# Patient Record
Sex: Male | Born: 1985 | Race: Black or African American | Hispanic: No | Marital: Married | Smoking: Never smoker
Health system: Southern US, Community
[De-identification: ages and names within clinical notes are randomized; demographics above are authoritative.]

---

## 2006-01-22 HISTORY — PX: TOOTH EXTRACTION: SHX859

## 2013-06-16 ENCOUNTER — Ambulatory Visit (INDEPENDENT_AMBULATORY_CARE_PROVIDER_SITE_OTHER): Payer: Managed Care, Other (non HMO) | Admitting: Adult Health

## 2013-06-16 ENCOUNTER — Encounter (INDEPENDENT_AMBULATORY_CARE_PROVIDER_SITE_OTHER): Payer: Self-pay

## 2013-06-16 ENCOUNTER — Encounter: Payer: Self-pay | Admitting: Adult Health

## 2013-06-16 VITALS — BP 116/78 | HR 67 | Temp 98.5°F | Resp 16 | Ht 71.5 in | Wt 204.2 lb

## 2013-06-16 DIAGNOSIS — L659 Nonscarring hair loss, unspecified: Secondary | ICD-10-CM

## 2013-06-16 DIAGNOSIS — Z7182 Exercise counseling: Secondary | ICD-10-CM

## 2013-06-16 DIAGNOSIS — M542 Cervicalgia: Secondary | ICD-10-CM

## 2013-06-16 MED ORDER — METHOCARBAMOL 750 MG PO TABS: 750.0000 mg | ORAL_TABLET | Freq: Three times a day (TID) | ORAL | Status: DC | PRN

## 2013-06-16 NOTE — Patient Instructions (Signed)
   Thank you for choosing [facility] for your health care needs.  Please schedule your physical exam at your earliest convenience.  You will need to be fasting.  Take robaxin as need for muscle spasms.  Take ibuprofen 600 mg every 6 hours for 4 days.  Apply ice for 15 min alternating with heat. Do this approximately 3-4 times a day.

## 2013-06-16 NOTE — Progress Notes (Signed)
Pre visit review using our clinic review tool, if applicable. No additional management support is needed unless otherwise documented below in the visit note. 

## 2013-06-16 NOTE — Progress Notes (Signed)
Subjective:    Patient ID: Leon Shaw, male    DOB: 30-Dec-1985, 30 y.o.   MRN: [id number]  HPI 28 y/o AA male presents today to establish care. States that he is overall healthy and has not been to a healthcare provider recently. Neck pain since earlier yesterday, mild discomfort. Took ibuprofen with minimal benefit. Denies radiating pain. Turning to the left makes it worse - no real improvements. Indicates it is staying about the same.   He works out regularly with heavy weights at a gym. Does not stretch following his work out. He is very health conscious. Eats healthy, well balanced meals. Takes supplements for weight gain. Currently weighs 204 lbs and he is trying to get up to 215 lb.   Pt has recently been seen by Dermatology for receding hairline. His father is bald and he is concerned about hair loss. Derm started him on proscar but did not review side effects with him. He began to read up on the side effects and has some concerns.   Medical Hx: Non contributory  Past Surgical History  Procedure Laterality Date  . Tooth extraction  2008    Family History  Problem Relation Age of Onset  . Cancer Mother [id number]    breast ca  . Hypertension Mother   . Hypertension Father     History   Social History  . Marital Status: Single    Spouse Name: Leon Shaw    Number of Children: Leon Shaw  . Years of Education: 16   Occupational History  . Laboratory Technologist    Social History Main Topics  . Smoking status: Never Smoker   . Smokeless tobacco: Never Used  . Alcohol Use: 1.8 oz/week    3 Cans of beer per week  . Drug Use: No  . Sexual Activity: Not on file   Other Topics Concern  . Not on file   Social History Narrative   Born in [place] and [place], [place]. Graduated from college and [employer] hired after college. Enjoys working out, listening to music, video games. Has one son, Leon Shaw (6 years). Currently private residence (apartment) and Morrie Sheldonshley (girlfriend).      Review of Systems  Constitutional: Negative.   HENT: Negative.   Eyes: Negative.   Respiratory: Negative.   Cardiovascular: Negative.   Gastrointestinal: Negative.   Endocrine: Negative.   Genitourinary: Negative.   Musculoskeletal: Negative.   Skin: Negative.        Father is bald. He is concerned about hair loss and receding hairline. Followed by Vaughan Sineerm who has started him on proscar.   Allergic/Immunologic: Negative.   Neurological: Negative.   Hematological: Negative.   Psychiatric/Behavioral: Negative.        Objective:  BP 116/78  Pulse 67  Temp(Src) 98.5 F (36.9 C) (Oral)  Resp 16  Ht 5' 11.5" (1.816 m)  Wt 204 lb 4 oz (92.647 kg)  BMI 28.09 kg/m2  SpO2 98%   Physical Exam  Constitutional: He is oriented to person, place, and time. He appears well-developed and well-nourished. No distress.  Lean body mass.   HENT:  Head: Normocephalic and atraumatic.  Eyes: Conjunctivae and EOM are normal.  Cardiovascular: Normal rate, regular rhythm, normal heart sounds and intact distal pulses.  Exam reveals no gallop and no friction rub.   No murmur heard. Pulmonary/Chest: Effort normal and breath sounds normal. No respiratory distress. He has no wheezes. He has no rales.  Musculoskeletal: He exhibits tenderness.  Tightness and tenderness over  trapezius muscle (neck) on the left side. Has good ROM with slight tightness with turning head towards the left.   Neurological: He is alert and oriented to person, place, and time.  Skin: Skin is warm and dry.  Receding hairline  Psychiatric: He has a normal mood and affect. His behavior is normal. Judgment and thought content normal.       Assessment & Plan:   1. Neck pain on left side Occurred yesterday when he reached to pick something off the floor. Suspect muscle tightness, spasm involving trapezius muscle. Pt does heavy lifting exercises. Recommend that he take time to warm up and then to stretch following exercises.  Continue ibuprofen although instructed to take 600 mg every 6 hours for the next 4 days. Add robaxin 3 times a day as needed for spasms. Discussed possibility that medication may cause sedation and needs to avoid driving if taking. Apply ice alternating with heat to the affected area for 15 min at a time. Do this 4-5 times a day.  2. Hair loss Derm has recently started him on proscar. Discussed common side effects including hypotension, decreased libido, impotence, sexual dysfunction. Most of the sexual side effects increase with age. The dose used for alopecia is lower than the dose for BPH. Advised to report any of these side effects immediately should they occur.   3. Exercise counseling Warm up prior to exercise. Stretching following exercise. Incorporate cardio into your work out either through interval training or with specific cardio exercises at least 3 times a week for 30 min.

## 2013-06-16 NOTE — Progress Notes (Signed)
Patient ID: Leon Shaw, male   DOB: March 04, 1985, 28 y.o.   MRN: [id number]

## 2013-06-17 ENCOUNTER — Encounter: Payer: Self-pay | Admitting: Adult Health

## 2013-06-17 DIAGNOSIS — L659 Nonscarring hair loss, unspecified: Secondary | ICD-10-CM | POA: Insufficient documentation

## 2013-06-17 DIAGNOSIS — M542 Cervicalgia: Secondary | ICD-10-CM | POA: Insufficient documentation

## 2014-12-10 ENCOUNTER — Encounter: Payer: Self-pay | Admitting: Family Medicine

## 2014-12-10 ENCOUNTER — Ambulatory Visit
Admission: RE | Admit: 2014-12-10 | Discharge: 2014-12-10 | Disposition: A | Discharge status: Home / Self Care | Payer: Managed Care, Other (non HMO) | Source: Ambulatory Visit | Attending: Family Medicine | Admitting: Family Medicine

## 2014-12-10 ENCOUNTER — Ambulatory Visit (INDEPENDENT_AMBULATORY_CARE_PROVIDER_SITE_OTHER): Payer: Managed Care, Other (non HMO) | Admitting: Family Medicine

## 2014-12-10 VITALS — BP 110/66 | HR 64 | Temp 97.7°F | Ht 71.5 in | Wt 198.2 lb

## 2014-12-10 DIAGNOSIS — R0789 Other chest pain: Secondary | ICD-10-CM

## 2014-12-10 DIAGNOSIS — M940 Chondrocostal junction syndrome [Tietze]: Secondary | ICD-10-CM | POA: Diagnosis not present

## 2014-12-10 NOTE — Assessment & Plan Note (Signed)
Patient's discomfort is most consistent with costochondritis. Very atypical in nature and given this and a lack of cardiac risk factors in a healthy young patient this is unlikely to be a cardiac cause. Unlikely to be PE given normal vital signs and no prior history. Unlikely to be pulmonary process given normal exam and normal oxygenation. This is most likely musculoskeletal. Given that it occurred after lifting heavy weights and having a bar [place] bounce off his chest slightly we will obtain a chest x-ray to rule out occult fracture. If there is no fracture he will start taking ibuprofen 600 mg every 6 hours with food for 5 days and then as needed. He'll continue to ice the area. If no better he'll follow-up in the office. Given return precautions.

## 2014-12-10 NOTE — Patient Instructions (Signed)
Nice to meet you. Your discomfort is likely related to costochondritis, though given persistence and possible injury while lifting weights we will obtain a chest x-ray to evaluate the bony structures in your chest. If this is negative for fracture you'll need to take an anti-inflammatory such as ibuprofen 600 mg every 6 hours for the next 5 days. You can ice the area as well. You should take the ibuprofen with food. If you develop persistent chest pain, shortness of breath, palpitations, sweating, fever, or any new or change in symptoms please seek medical attention.

## 2014-12-10 NOTE — Progress Notes (Signed)
Patient ID: Leon Shaw, male   DOB: 09/01/1985, 29 y.o.   MRN: [id number]  Marikay Alar, MD Phone: [phone]  Leon Shaw is a 29 y.o. male who presents today for same-day visit.  Costochondritis: Patient notes about 5 months ago he was at the gym doing bench press when he noted a sharp stabbing sensation in the left side of his sternum. He notes this only occurred when he was actively doing bench press or pushups. Does not radiate anywhere. Has no shortness of breath, diaphoresis, or palpitations with this. He only notices the discomfort when he does a pushup-type maneuver or dips. He does not have the discomfort right now. He is not have the discomfort in between the above movements He notes when this occurred he was lifting about 325 pounds and noted that the bar bounced off his chest slightly. He has no history of cholesterol issues, hypertension, or diabetes. He has no history of DVT or PE. He has not had any leg swelling. He has not had any recent periods of immobility. He has no family history of heart disease. He has tried RICE for this with little benefit. It is slowly getting better, though it is not completely improved and he is ready to get back to the gym. He has not lifted since this occurred. He has not tried any anti-inflammatories for this.  PMH: nonsmoker.   ROS see history of present illness  Objective  Physical Exam Filed Vitals:   12/10/14 0917  BP: 110/66  Pulse: 64  Temp: 97.7 F (36.5 C)   Physical Exam  Constitutional: He is well-developed, well-nourished, and in no distress.  HENT:  Head: Normocephalic and atraumatic.  Cardiovascular: Normal rate, regular rhythm and normal heart sounds.  Exam reveals no gallop and no friction rub.   No murmur heard. Pulmonary/Chest: Effort normal and breath sounds normal. No respiratory distress. He has no wheezes. He has no rales. He exhibits tenderness (Mild discomfort on palpation of left costochondral  joints).  Neurological:  5/5 strength in bilateral biceps, triceps, grip, quads, hamstrings, plantar and dorsiflexion, sensation to light touch intact in bilateral UE and LE, normal gait, 2+ patellar reflexes  Skin: Skin is warm and dry. He is not diaphoretic.   hands are warm and well perfused   Assessment/Plan: Please see individual problem list.  Costochondritis Patient's discomfort is most consistent with costochondritis. Very atypical in nature and given this and a lack of cardiac risk factors in a healthy young patient this is unlikely to be a cardiac cause. Unlikely to be PE given normal vital signs and no prior history. Unlikely to be pulmonary process given normal exam and normal oxygenation. This is most likely musculoskeletal. Given that it occurred after lifting heavy weights and having a bar [place] bounce off his chest slightly we will obtain a chest x-ray to rule out occult fracture. If there is no fracture he will start taking ibuprofen 600 mg every 6 hours with food for 5 days and then as needed. He'll continue to ice the area. If no better he'll follow-up in the office. Given return precautions.    Orders Placed This Encounter  Procedures  . DG Chest 2 View    Standing Status: Future     Number of Occurrences: 1     Standing Expiration Date: 02/09/2016    Order Specific Question:  Reason for Exam (SYMPTOM  OR DIAGNOSIS REQUIRED)    Answer:  musculoskeletal sternum pain s/p bench press of 325 lbs 5  months ago    Order Specific Question:  Preferred imaging location?    Answer:  [facility]    Intisar Claudio

## 2014-12-10 NOTE — Progress Notes (Signed)
Pre visit review using our clinic review tool, if applicable. No additional management support is needed unless otherwise documented below in the visit note. 

## 2014-12-14 ENCOUNTER — Encounter: Payer: Self-pay | Admitting: Family Medicine

## 2014-12-27 ENCOUNTER — Ambulatory Visit (INDEPENDENT_AMBULATORY_CARE_PROVIDER_SITE_OTHER): Payer: Managed Care, Other (non HMO) | Admitting: Family Medicine

## 2014-12-27 ENCOUNTER — Encounter: Payer: Self-pay | Admitting: Family Medicine

## 2014-12-27 VITALS — BP 104/60 | HR 68 | Temp 98.1°F | Ht 71.5 in | Wt 204.8 lb

## 2014-12-27 DIAGNOSIS — M940 Chondrocostal junction syndrome [Tietze]: Secondary | ICD-10-CM

## 2014-12-27 DIAGNOSIS — Z0000 Encounter for general adult medical examination without abnormal findings: Secondary | ICD-10-CM | POA: Diagnosis not present

## 2014-12-27 DIAGNOSIS — E663 Overweight: Secondary | ICD-10-CM | POA: Diagnosis not present

## 2014-12-27 DIAGNOSIS — Z13220 Encounter for screening for lipoid disorders: Secondary | ICD-10-CM

## 2014-12-27 DIAGNOSIS — Z23 Encounter for immunization: Secondary | ICD-10-CM

## 2014-12-27 DIAGNOSIS — Z0001 Encounter for general adult medical examination with abnormal findings: Secondary | ICD-10-CM | POA: Insufficient documentation

## 2014-12-27 LAB — COMPREHENSIVE METABOLIC PANEL
ALBUMIN: 4.3 g/dL (ref 3.5–5.2)
ALK PHOS: 74 U/L (ref 39–117)
ALT: 37 U/L (ref 0–53)
AST: 34 U/L (ref 0–37)
BUN: 17 mg/dL (ref 6–23)
CALCIUM: 9.5 mg/dL (ref 8.4–10.5)
CO2: 28 mEq/L (ref 19–32)
Chloride: 105 mEq/L (ref 96–112)
Creatinine, Ser: 1.27 mg/dL (ref 0.40–1.50)
GFR: 85.01 mL/min (ref >60.00)
Glucose, Bld: 67 mg/dL — ABNORMAL LOW (ref 70–99)
POTASSIUM: 4.2 meq/L (ref 3.5–5.1)
Sodium: 140 mEq/L (ref 135–145)
TOTAL PROTEIN: 7.4 g/dL (ref 6.0–8.3)
Total Bilirubin: 1 mg/dL (ref 0.2–1.2)

## 2014-12-27 LAB — LIPID PANEL
CHOLESTEROL: 117 mg/dL (ref 0–200)
HDL: 46.1 mg/dL (ref >39.00)
LDL Cholesterol: 60 mg/dL (ref 0–99)
NonHDL: 70.83
Total CHOL/HDL Ratio: 3
Triglycerides: 56 mg/dL (ref 0.0–149.0)
VLDL: 11.2 mg/dL (ref 0.0–40.0)

## 2014-12-27 LAB — HEMOGLOBIN A1C: HEMOGLOBIN A1C: 5.5 % (ref 4.6–6.5)

## 2014-12-27 NOTE — Progress Notes (Signed)
Pre visit review using our clinic review tool, if applicable. No additional management support is needed unless otherwise documented below in the visit note. 

## 2014-12-27 NOTE — Assessment & Plan Note (Signed)
Patient doing well at this time. Technically by BMI he is overweight, though suspect this is mostly muscle mass. He does not smoke. He drinks minimal alcohol. His blood pressure is in the normal range. He exercises and eats well. Has had a negative HIV test per his report. We will give a flu shot and a Tdap. We will check lipid panel, CMP, and A1c.

## 2014-12-27 NOTE — Patient Instructions (Signed)
Nice to see you. Please continue ibuprofen as needed and the foam roller for the soreness you have. We will check some blood work today. If you develop chest pain, change in your soreness, shortness of breath, palpitations, or any new or change in symptoms please seek medical attention.

## 2014-12-27 NOTE — Assessment & Plan Note (Signed)
Much improved. We'll continue to monitor. He'll continue as needed ibuprofen. He'll continue the foam roller. He is given return precautions.

## 2014-12-27 NOTE — Progress Notes (Signed)
Patient ID: Leon Shaw, male   DOB: February 11, 1985, 29 y.o.   MRN: [id number]  Tommi Rumps, MD Phone: [phone]  Leon Shaw is a 29 y.o. male who presents today for yearly physical.  Patient does not smoke. He drinks alcohol 1-2 times a week. He exercises multiple days a week and lifts weights. He eats a healthy diet. Does not drink soda. He also takes water. He does drink sweet tea one time every couple weeks. He has been tested for HIV in the past and was negative. He has not had a flu shot this year. He cannot remember his last DTaP.  Patient notes his costochondritis is significantly better. Only hurts minimally when doing bench press-type exercises. He has taken ibuprofen and this helped. He has been using a foam roller as well with benefit. Has not had any radiation. Has not had any diaphoresis. Has not had any shortness of breath. No history of VTE. So exercise otherwise with no issues. No family history of cardiac issues. Chest x-ray last visit was normal. No risk factors for cardiac disease. No risk factors for VTE.    Review of Systems  Constitutional: Negative for fever.  HENT: Negative for congestion, hearing loss, sore throat and tinnitus.   Eyes: Negative for blurred vision and double vision.  Respiratory: Negative for cough and shortness of breath.   Cardiovascular: Negative for palpitations and leg swelling.  Gastrointestinal: Negative for heartburn, nausea, vomiting and abdominal pain.  Genitourinary: Negative for dysuria and urgency.  Musculoskeletal: Negative for myalgias, back pain, joint pain and neck pain.  Skin: Negative for rash.  Neurological: Negative for dizziness, sensory change, focal weakness and headaches.  Psychiatric/Behavioral: Negative for depression and memory loss.    Objective  Physical Exam Filed Vitals:   12/27/14 0933  BP: 104/60  Pulse: 68  Temp: 98.1 F (36.7 C)    Physical Exam  Constitutional: He is well-developed,  well-nourished, and in no distress.  HENT:  Head: Normocephalic and atraumatic.  Right Ear: External ear normal.  Left Ear: External ear normal.  Cardiovascular: Normal rate, regular rhythm and normal heart sounds.  Exam reveals no gallop and no friction rub.   No murmur heard. Pulmonary/Chest: Effort normal and breath sounds normal. No respiratory distress. He has no wheezes. He has no rales. He exhibits tenderness (left costochondral minimal soreness).  Abdominal: Soft. Bowel sounds are normal. He exhibits no distension. There is no tenderness. There is no rebound and no guarding.  Musculoskeletal: Normal range of motion. He exhibits no edema.  Neurological: He is alert. Gait normal.  Skin: Skin is warm. He is not diaphoretic.  Psychiatric: Mood and affect normal.     Assessment/Plan: Please see individual problem list.  Costochondritis Much improved. We'll continue to monitor. He'll continue as needed ibuprofen. He'll continue the foam roller. He is given return precautions.  Annual physical exam Patient doing well at this time. Technically by BMI he is overweight, though suspect this is mostly muscle mass. He does not smoke. He drinks minimal alcohol. His blood pressure is in the normal range. He exercises and eats well. Has had a negative HIV test per his report. We will give a flu shot and a Tdap. We will check lipid panel, CMP, and A1c.    Orders Placed This Encounter  Procedures  . Tdap vaccine greater than or equal to 7yo IM  . Lipid Profile  . Comp Met (CMET)  . HgB A1c    Tommi Rumps

## 2015-10-28 ENCOUNTER — Encounter: Payer: Self-pay | Admitting: Family Medicine

## 2015-10-28 ENCOUNTER — Ambulatory Visit (INDEPENDENT_AMBULATORY_CARE_PROVIDER_SITE_OTHER): Payer: Managed Care, Other (non HMO) | Admitting: Family Medicine

## 2015-10-28 VITALS — BP 118/86 | HR 69 | Temp 97.6°F | Wt 195.8 lb

## 2015-10-28 DIAGNOSIS — F4320 Adjustment disorder, unspecified: Secondary | ICD-10-CM

## 2015-10-28 DIAGNOSIS — F4321 Adjustment disorder with depressed mood: Secondary | ICD-10-CM

## 2015-10-28 DIAGNOSIS — Z23 Encounter for immunization: Secondary | ICD-10-CM | POA: Diagnosis not present

## 2015-10-28 MED ORDER — CLONAZEPAM 0.5 MG PO TABS: 0.5000 mg | ORAL_TABLET | Freq: Two times a day (BID) | ORAL | 1 refills | Status: DC | PRN

## 2015-10-28 NOTE — Patient Instructions (Signed)
Complicated Grieving Grief is a normal response to the death of someone close to you. Feelings of fear, anger, and guilt can affect almost everyone who loses a loved one. It is also common to have symptoms of depression while you are grieving. These include problems with sleep, loss of appetite, and lack of energy. They may last for weeks or months after a loss. Complicated grief is different from normal grief or depression. Normal grieving involves sadness and feelings of loss, but these feelings are not constant. Complicated grief is a constant and severe type of grief. It interferes with your ability to function normally. It may last for several months to a year or longer. Complicated grief may require treatment from a mental health care provider. CAUSES  It is not known why some people continue to struggle with grief and others do not. You may be at higher risk for complicated grief if:  The death of your loved one was sudden or unexpected.  The death of your loved one was due to a violent event.  Your loved one committed suicide.  Your loved one was a child or a young person.  You were very close to or dependent on the loved one.  You have a history of depression. SIGNS AND SYMPTOMS Signs and symptoms of complicated grief may include:  Feeling disbelief or numbness.  Being unable to enjoy good memories of your loved one.  Needing to avoid anything that reminds you of your loved one.  Being unable to stop thinking about the death.  Feeling intense anger or guilt.  Feeling alone and hopeless.  Feeling that your life is meaningless and empty.  Losing the desire to live. DIAGNOSIS Your health care provider may diagnose complicated grief if:  You have constant symptoms of grief for 6-12 months or longer.  Your symptoms are interfering with your ability to live your life. Your health care provider may want you to see a mental health care provider. Many symptoms of depression  are similar to the symptoms of complicated grief. It is important to be evaluated for complicated grief along with other mental health conditions. TREATMENT  Talk therapy with a mental health provider is the most common treatment for complicated grief. During therapy, you will learn healthy ways to cope with the loss of your loved one. In some cases, your mental health care provider may also recommend antidepressant medicines. HOME CARE INSTRUCTIONS  Take care of yourself.  Eat regular meals and maintain a healthy diet. Eat plenty of fruits, vegetables, and whole grains.  Try to get some exercise each day.  Keep regular hours for sleep. Try to get at least 8 hours of sleep each night.  Do not use drugs or alcohol to ease your symptoms.  Take medicines only as directed by your health care provider.  Spend time with friends and loved ones.  Consider joining a grief (bereavement) support group to help you deal with your loss.  Keep all follow-up visits as directed by your health care provider. This is important. SEEK MEDICAL CARE IF:  Your symptoms keep you from functioning normally.  Your symptoms do not get better with treatment. SEEK IMMEDIATE MEDICAL CARE IF:  You have serious thoughts of hurting yourself or someone else.  You have suicidal feelings.   This information is not intended to replace advice given to you by your health care provider. Make sure you discuss any questions you have with your health care provider.   Document Released: 01/08/2005   Document Revised: 09/29/2014 Document Reviewed: 06/18/2013 Elsevier Interactive Patient Education 2016 Elsevier Inc.  

## 2015-10-28 NOTE — Progress Notes (Signed)
Subjective:  Patient ID: Leon Shaw, male    DOB: 07-22-1985  Age: 30 y.o. MRN: [id number]  CC: Several issues; recent death of father  HPI:  30 year old male presents with several complaints.  Patient states that his father passed away abruptly on 11-12-2022. Since that time, he's had significant difficulty coping. He states that it feels like he is in a "mental fog". He is not sleeping well. He's had decrease in appetite. He is not exercising. He states that he feels anxious and is having "stomach problems" and diarrhea. He states that he's talked with his family members but continues to have difficulty coping with the loss of his father. He states his girlfriend was concerned about him and thought he should come in for evaluation. He was in agreement with this. He states that he is unsure what to do and how to make things better.  Social Hx  Social History   Social History  . Marital status: Single    Spouse name: N/A  . Number of children: N/A  . Years of education: 19   Occupational History  . Laboratory Technologist [employer]   Social History Main Topics  . Smoking status: Never Smoker  . Smokeless tobacco: Never Used  . Alcohol use 1.8 oz/week    3 Cans of beer per week  . Drug use: No  . Sexual activity: Not Asked   Other Topics Concern  . None   Social History Narrative   Born in [place], [place] and raised [place], [place]. Graduated from college and [employer] hired after college. Enjoys working out, listening to music, video games. Has one son, Joselyn Glassman (6 years). Currently private residence (apartment) and Morrie Sheldon (girlfriend).    Review of Systems  Constitutional: Positive for appetite change and fatigue.  Gastrointestinal: Positive for abdominal pain and diarrhea.  Psychiatric/Behavioral: The patient is nervous/anxious.    Objective:  BP 118/86 (BP Location: Right Arm, Patient Position: Sitting, Cuff Size: Normal)   Pulse 69   Temp 97.6 F (36.4 C) (Oral)   Wt 195 lb  12 oz (88.8 kg)   SpO2 97%   BMI 26.92 kg/m   BP/Weight 10/28/2015 12/27/2014 12/10/2014  Systolic BP 118 104 110  Diastolic BP 86 60 66  Wt. (Lbs) 195.75 204.75 198.2  BMI 26.92 28.16 27.26    Physical Exam  Constitutional: He is oriented to person, place, and time. He appears well-developed. No distress.  Cardiovascular: Normal rate and regular rhythm.   Pulmonary/Chest: Effort normal. He has no wheezes. He has no rales.  Abdominal: Soft. He exhibits no distension. There is no tenderness. There is no rebound and no guarding.  Neurological: He is alert and oriented to person, place, and time.  Psychiatric:  Flat affect. Appears down/depressed.  Vitals reviewed.  Lab Results  Component Value Date   GLUCOSE 67 (L) 12/27/2014   CHOL 117 12/27/2014   TRIG 56.0 12/27/2014   HDL 46.10 12/27/2014   LDLCALC 60 12/27/2014   ALT 37 12/27/2014   AST 34 12/27/2014   NA 140 12/27/2014   K 4.2 12/27/2014   CL 105 12/27/2014   CREATININE 1.27 12/27/2014   BUN 17 12/27/2014   CO2 28 12/27/2014   HGBA1C 5.5 12/27/2014    Assessment & Plan:   Problem List Items Addressed This Visit    Grief reaction - Primary    New problem. Patient with somatic complaints and having difficulty coping with loss of his father. Discussed the need to see a counselor and  patient is in agreement. PRN Klonopin.       Other Visit Diagnoses   None.     Meds ordered this encounter  Medications  . clonazePAM (KLONOPIN) 0.5 MG tablet    Sig: Take 1 tablet (0.5 mg total) by mouth 2 (two) times daily as needed for anxiety.    Dispense:  20 tablet    Refill:  1    Follow-up: Return in about 2 weeks (around 11/11/2015) for With me or PCP.  Everlene OtherJayce Alpa Salvo DO [facility] [place]

## 2015-10-28 NOTE — Progress Notes (Signed)
Pre visit review using our clinic review tool, if applicable. No additional management support is needed unless otherwise documented below in the visit note. 

## 2015-10-28 NOTE — Assessment & Plan Note (Signed)
New problem. Patient with somatic complaints and having difficulty coping with loss of his father. Discussed the need to see a counselor and patient is in agreement. PRN Klonopin.

## 2015-11-10 ENCOUNTER — Ambulatory Visit: Payer: Managed Care, Other (non HMO) | Admitting: Family Medicine

## 2015-12-28 ENCOUNTER — Encounter: Payer: Managed Care, Other (non HMO) | Admitting: Family Medicine

## 2015-12-28 DIAGNOSIS — Z0289 Encounter for other administrative examinations: Secondary | ICD-10-CM

## 2017-04-26 IMAGING — CR DG CHEST 2V
1 series · 2 of 2 positions shown · non-contrast
Comparison: None.

CLINICAL DATA: Musculoskeletal chest pain

EXAM:
CHEST  2 VIEW

[Series 1: dg chest 2 view · 0.14mm/px · 2 of 2 slices shown]
[im 1/2]
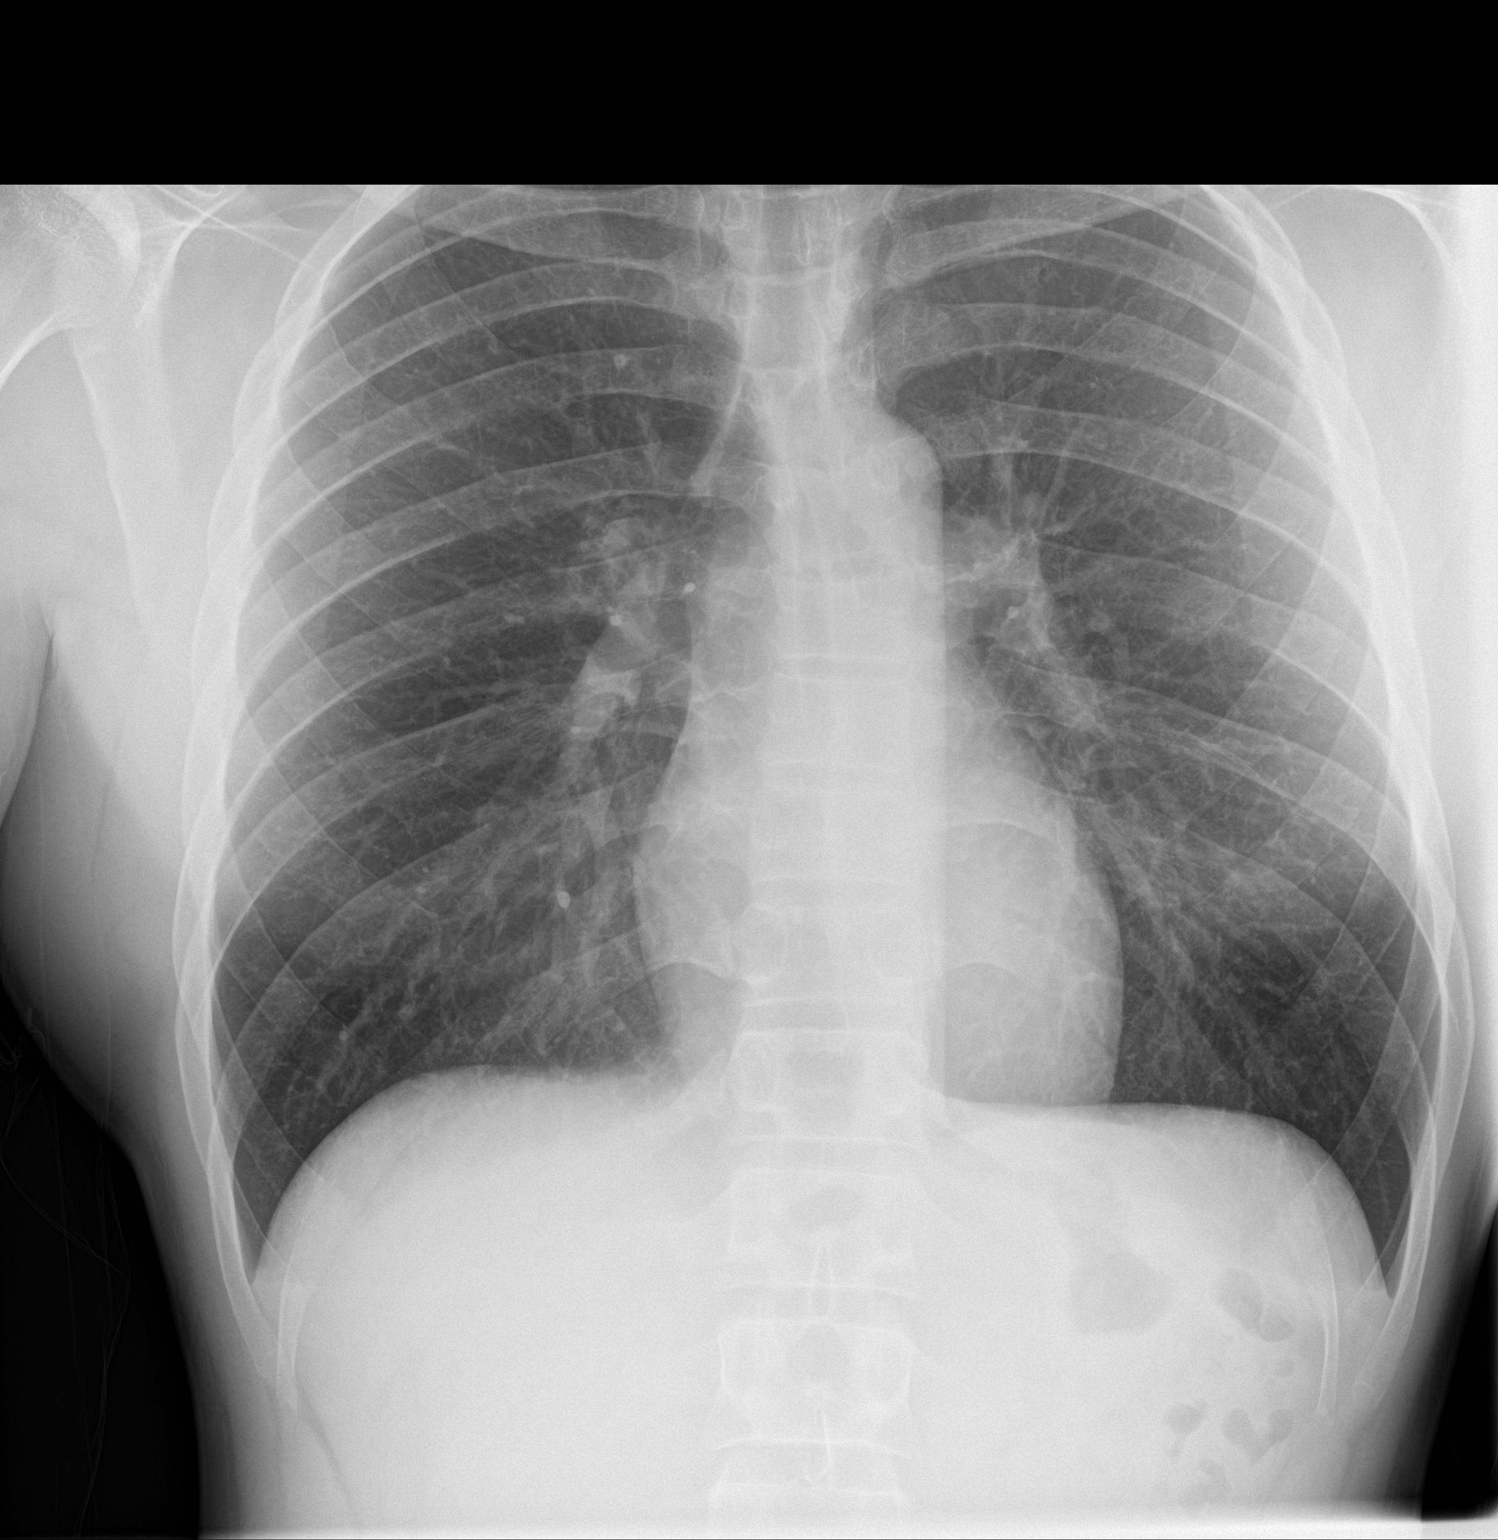
[im 2/2]
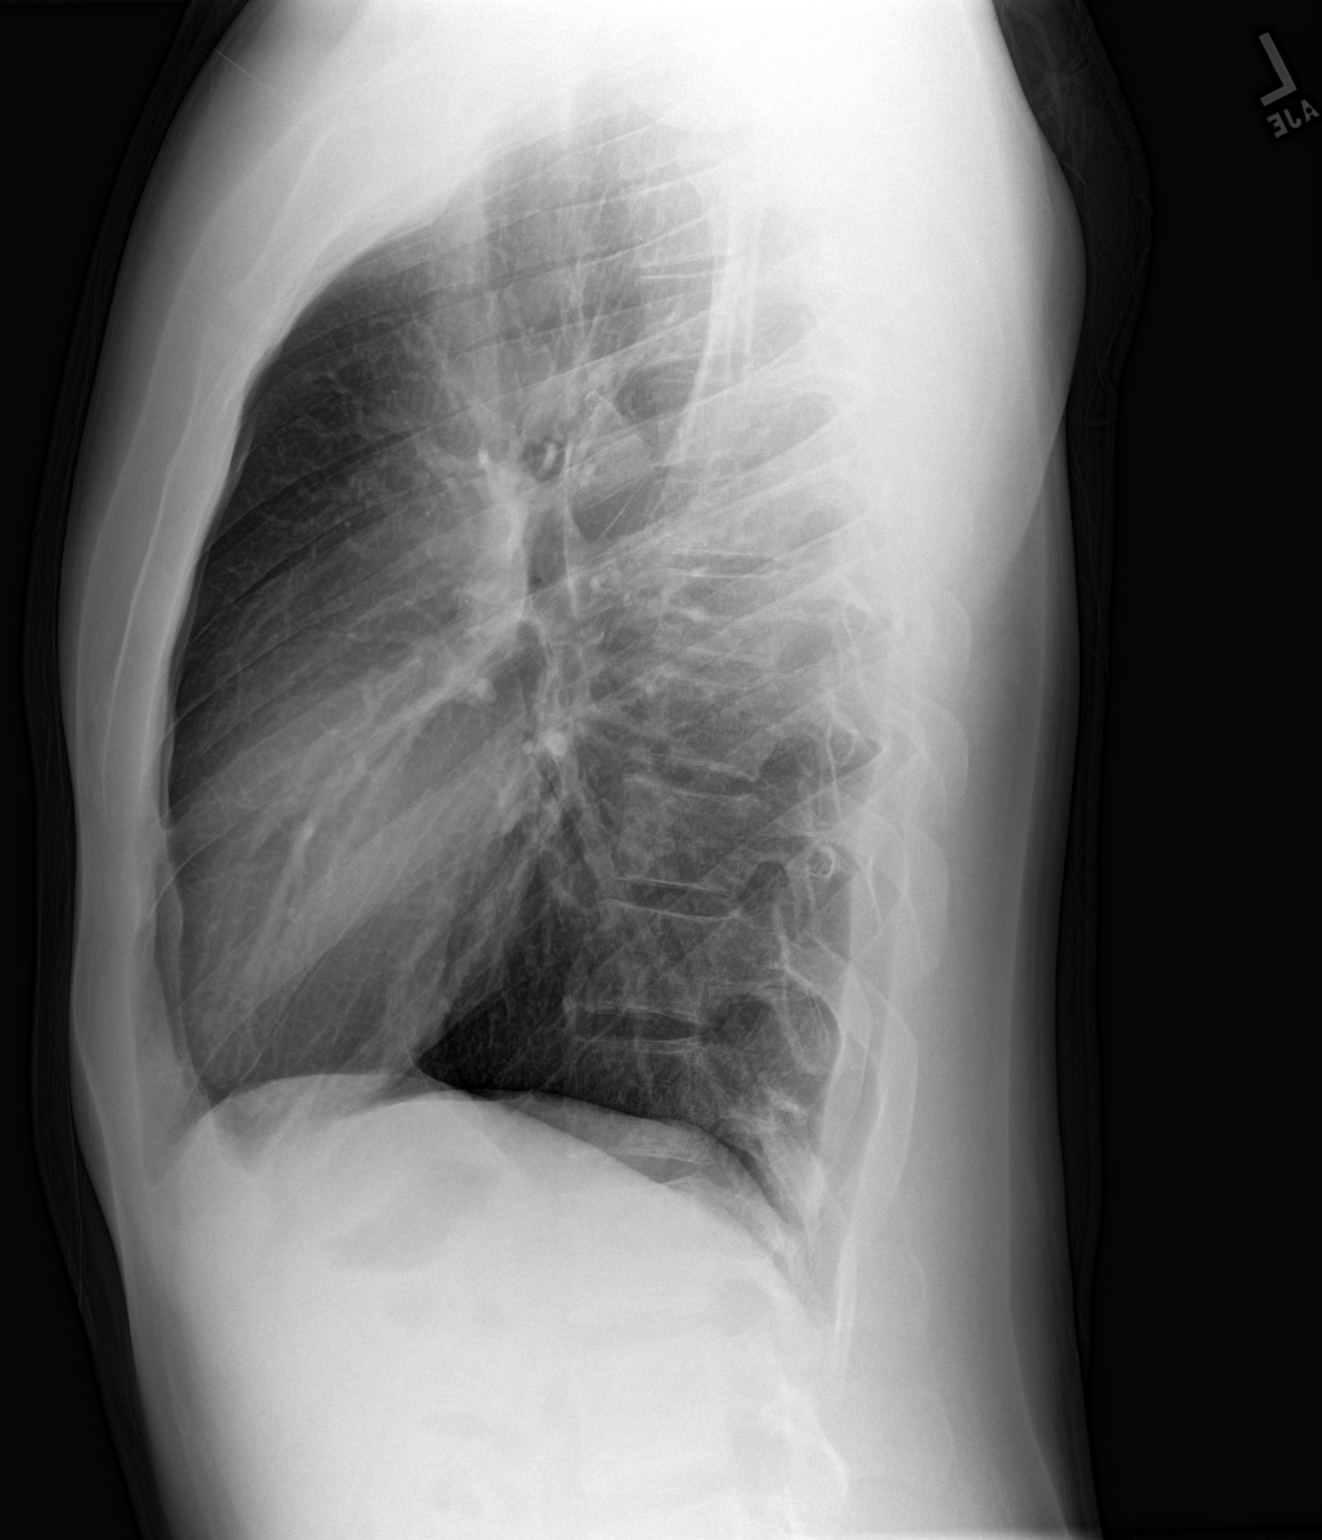

[2 of 2 positions shown; findings below may reference images not displayed]

FINDINGS: The heart size and mediastinal contours are within normal limits.
Both lungs are clear. The visualized skeletal structures are
unremarkable.
IMPRESSION: No active cardiopulmonary disease.

## 2017-09-16 ENCOUNTER — Ambulatory Visit (INDEPENDENT_AMBULATORY_CARE_PROVIDER_SITE_OTHER): Payer: Managed Care, Other (non HMO) | Admitting: Family Medicine

## 2017-09-16 ENCOUNTER — Encounter: Payer: Self-pay | Admitting: Family Medicine

## 2017-09-16 VITALS — BP 110/78 | HR 56 | Temp 98.1°F | Ht 72.5 in | Wt 209.6 lb

## 2017-09-16 DIAGNOSIS — L230 Allergic contact dermatitis due to metals: Secondary | ICD-10-CM | POA: Diagnosis not present

## 2017-09-16 DIAGNOSIS — F419 Anxiety disorder, unspecified: Secondary | ICD-10-CM | POA: Insufficient documentation

## 2017-09-16 DIAGNOSIS — Z0001 Encounter for general adult medical examination with abnormal findings: Secondary | ICD-10-CM

## 2017-09-16 DIAGNOSIS — L259 Unspecified contact dermatitis, unspecified cause: Secondary | ICD-10-CM | POA: Insufficient documentation

## 2017-09-16 NOTE — Assessment & Plan Note (Signed)
I suspect he is allergic to the metal in his belt buckle.  Rash is consistent with contact dermatitis.  Discussed buying a different belt.  He can use hydrocortisone over-the-counter as needed.

## 2017-09-16 NOTE — Patient Instructions (Signed)
Nice to see you. Please look at the information on Lexapro if you would like to try this for your anxiety please let us know.  We could also refer you to a counselor as well. Please try to get a different belt to see if that is beneficial.  You can also use hydrocortisone on the area of the rash.

## 2017-09-16 NOTE — Assessment & Plan Note (Addendum)
Chronic issue.  Discussed potentially starting on Lexapro versus doing therapy.  He will review information on Lexapro and let us know how he would like to proceed.  Follow-up in 2 to 3 months.

## 2017-09-16 NOTE — Progress Notes (Signed)
Leon Rumps, MD Phone: [phone]  Leon Shaw is a 32 y.o. male who presents today for CPE.  Exercises 4-5 days per week by lifting weights and running. Eats home cooked meals for the most part.  Does get a good amount of vegetables. No family history of prostate cancer or colon cancer. Does report prior HIV testing. Tetanus vaccination up-to-date. He sees a dentist 2 times per year and ophthalmologist once per year. He reports having 1-2 alcoholic beverages about twice a week.  No illicit drugs or tobacco use. He reports having biometric screening done through work about a month ago. He does report some anxiety.  This has been a long-term issue.  Did worsen some after his father passed away a couple of years ago.  He notes being afraid to try different things.  He has seen a counselor in the past though only saw them once.  No depression. Does report a rash in the area where his belt contacts his skin.  Typically only occurs if he falls asleep while wearing his belt.  He has tried hydrocortisone on it with good benefit.  Active Ambulatory Problems    Diagnosis Date Noted  . Encounter for general adult medical examination with abnormal findings 12/27/2014  . Grief reaction 10/28/2015  . Anxiety 09/16/2017  . Contact dermatitis 09/16/2017   Resolved Ambulatory Problems    Diagnosis Date Noted  . Neck pain on left side 06/17/2013  . Hair loss 06/17/2013  . Costochondritis 12/10/2014   No Additional Past Medical History    Family History  Problem Relation Age of Onset  . Cancer Mother 45       breast ca  . Hypertension Mother   . Hypertension Father     Social History   Socioeconomic History  . Marital status: Single    Spouse name: Not on file  . Number of children: Not on file  . Years of education: 33  . Highest education level: Not on file  Occupational History  . Occupation: Designer, television/film set: [employer]  . Financial  resource strain: Not on file  . Food insecurity:    Worry: Not on file    Inability: Not on file  . Transportation needs:    Medical: Not on file    Non-medical: Not on file  Tobacco Use  . Smoking status: Never Smoker  . Smokeless tobacco: Never Used  Substance and Sexual Activity  . Alcohol use: Yes    Alcohol/week: 3.0 standard drinks    Types: 3 Cans of beer per week  . Drug use: No  . Sexual activity: Not on file  Lifestyle  . Physical activity:    Days per week: Not on file    Minutes per session: Not on file  . Stress: Not on file  Relationships  . Social connections:    Talks on phone: Not on file    Gets together: Not on file    Attends religious service: Not on file    Active member of club or organization: Not on file    Attends meetings of clubs or organizations: Not on file    Relationship status: Not on file  . Intimate partner violence:    Fear of current or ex partner: Not on file    Emotionally abused: Not on file    Physically abused: Not on file    Forced sexual activity: Not on file  Other Topics Concern  . Not on file  Social History Narrative   Born in [place], [place] and raised [place], [place]. Graduated from college and [employer] hired after college. Enjoys working out, listening to music, video games. Has one son, Leon Shaw (6 years). Currently private residence (apartment) and Leon Shaw (girlfriend).     ROS  General:  Negative for nexplained weight loss, fever Skin: Negative for new or changing mole, sore that won't heal HEENT: Negative for trouble hearing, trouble seeing, ringing in ears, mouth sores, hoarseness, change in voice, dysphagia. CV:  Negative for chest pain, dyspnea, edema, palpitations Resp: Negative for cough, dyspnea, hemoptysis GI: Negative for nausea, vomiting, diarrhea, constipation, abdominal pain, melena, hematochezia. GU: Negative for dysuria, incontinence, urinary hesitance, hematuria, vaginal or penile discharge, polyuria, sexual  difficulty, lumps in testicle or breasts MSK: Negative for muscle cramps or aches, joint pain or swelling Neuro: Negative for headaches, weakness, numbness, dizziness, passing out/fainting Psych: Positive for anxiety, negative for depression, memory problems  Objective  Physical Exam Vitals:   09/16/17 1416  BP: 110/78  Pulse: (!) 56  Temp: 98.1 F (36.7 C)  SpO2: 98%    BP Readings from Last 3 Encounters:  09/16/17 110/78  10/28/15 118/86  12/27/14 104/60   Wt Readings from Last 3 Encounters:  09/16/17 209 lb 9.6 oz (95.1 kg)  10/28/15 195 lb 12 oz (88.8 kg)  12/27/14 204 lb 12 oz (92.9 kg)    Physical Exam  Constitutional: No distress.  HENT:  Head: Normocephalic and atraumatic.  Mouth/Throat: Oropharynx is clear and moist.  Eyes: Pupils are equal, round, and reactive to light. Conjunctivae are normal.  Cardiovascular: Normal rate, regular rhythm and normal heart sounds.  Pulmonary/Chest: Effort normal and breath sounds normal.  Abdominal: Soft. Bowel sounds are normal. He exhibits no distension. There is no tenderness. There is no rebound and no guarding.  Musculoskeletal: He exhibits no edema.  Neurological: He is alert.  Skin: Skin is warm and dry. He is not diaphoretic.     Psychiatric:  Mood anxious, affect normal     Assessment/Plan:   Encounter for general adult medical examination with abnormal findings Physical exam completed.  Encouraged continued diet and exercise.  He will get a flu shot through work.  We will check a CMP today.  He pulled up his lab work from his biometric screening and his cholesterol was well controlled.  His A1c was in the normal range.  He was immune to measles, mumps, and rubella.  Anxiety Chronic issue.  Discussed potentially starting on Lexapro versus doing therapy.  He will review information on Lexapro and let us know how he would like to proceed.  Follow-up in 2 to 3 months.  Contact dermatitis I suspect he is allergic  to the metal in his belt buckle.  Rash is consistent with contact dermatitis.  Discussed buying a different belt.  He can use hydrocortisone over-the-counter as needed.   Orders Placed This Encounter  Procedures  . Comp Met (CMET)    No orders of the defined types were placed in this encounter.    Leon Rumps, MD [place]

## 2017-09-16 NOTE — Assessment & Plan Note (Addendum)
Physical exam completed.  Encouraged continued diet and exercise.  He will get a flu shot through work.  We will check a CMP today.  He pulled up his lab work from his biometric screening and his cholesterol was well controlled.  His A1c was in the normal range.  He was immune to measles, mumps, and rubella.

## 2017-09-17 LAB — COMPREHENSIVE METABOLIC PANEL
A/G RATIO: 1.9 (ref 1.2–2.2)
ALBUMIN: 4.7 g/dL (ref 3.5–5.5)
ALK PHOS: 73 IU/L (ref 39–117)
ALT: 24 IU/L (ref 0–44)
AST: 22 IU/L (ref 0–40)
BILIRUBIN TOTAL: 0.8 mg/dL (ref 0.0–1.2)
BUN / CREAT RATIO: 13 (ref 9–20)
BUN: 16 mg/dL (ref 6–20)
CO2: 23 mmol/L (ref 20–29)
CREATININE: 1.2 mg/dL (ref 0.76–1.27)
Calcium: 9.5 mg/dL (ref 8.7–10.2)
Chloride: 103 mmol/L (ref 96–106)
GFR calc Af Amer: 91 mL/min/{1.73_m2} (ref >59)
GFR calc non Af Amer: 79 mL/min/{1.73_m2} (ref >59)
GLOBULIN, TOTAL: 2.5 g/dL (ref 1.5–4.5)
Glucose: 93 mg/dL (ref 65–99)
POTASSIUM: 4.8 mmol/L (ref 3.5–5.2)
SODIUM: 142 mmol/L (ref 134–144)
Total Protein: 7.2 g/dL (ref 6.0–8.5)

## 2017-09-18 ENCOUNTER — Encounter: Payer: Self-pay | Admitting: Family Medicine

## 2017-09-19 MED ORDER — ESCITALOPRAM OXALATE 10 MG PO TABS: 10.0000 mg | ORAL_TABLET | Freq: Every day | ORAL | 3 refills | Status: DC

## 2017-11-12 ENCOUNTER — Ambulatory Visit: Payer: Managed Care, Other (non HMO) | Admitting: Family Medicine

## 2017-11-12 DIAGNOSIS — Z0289 Encounter for other administrative examinations: Secondary | ICD-10-CM

## 2018-06-03 ENCOUNTER — Telehealth: Payer: Self-pay | Admitting: Family Medicine

## 2018-06-03 ENCOUNTER — Other Ambulatory Visit: Payer: Self-pay

## 2018-06-03 ENCOUNTER — Encounter: Payer: Self-pay | Admitting: Family Medicine

## 2018-06-03 ENCOUNTER — Ambulatory Visit (INDEPENDENT_AMBULATORY_CARE_PROVIDER_SITE_OTHER): Payer: Managed Care, Other (non HMO) | Admitting: Family Medicine

## 2018-06-03 DIAGNOSIS — F419 Anxiety disorder, unspecified: Secondary | ICD-10-CM

## 2018-06-03 MED ORDER — ESCITALOPRAM OXALATE 10 MG PO TABS: 10.0000 mg | ORAL_TABLET | Freq: Every day | ORAL | 3 refills | Status: DC

## 2018-06-03 NOTE — Telephone Encounter (Signed)
Please contact the patient and get him set up for follow-up in 2 months.  Thanks.

## 2018-06-03 NOTE — Assessment & Plan Note (Addendum)
Worsened issues with this.  We will start him on Lexapro.  Discussed potential side effects of drowsiness, insomnia, sexual dysfunction, and worsening anxiety.  If these occur he will let us know.  Offered referral for therapy though he deferred this.  Follow-up in 2 months.

## 2018-06-03 NOTE — Progress Notes (Signed)
Virtual Visit via telephone note  This visit type was conducted due to national recommendations for restrictions regarding the COVID-19 pandemic (e.g. social distancing).  This format is felt to be most appropriate for this patient at this time.  All issues noted in this document were discussed and addressed.  No physical exam was performed (except for noted visual exam findings with Video Visits).   I connected with Leon Shaw today at  3:00 PM EDT by telephone and verified that I am speaking with the correct person using two identifiers. Location patient: car, not driving Location provider: work or home office Persons participating in the virtual visit: patient, provider  I discussed the limitations, risks, security and privacy concerns of performing an evaluation and management service by telephone and the availability of in person appointments. I also discussed with the patient that there may be a patient responsible charge related to this service. The patient expressed understanding and agreed to proceed.  Reason for visit: f/u  HPI: Anxiety: Patient notes he has been having more issues with this lately.  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  He wakes up anxious and it lasts all day until he goes to bed.  He is not consciously aware of anything else that is affecting this.  He notes no depression.  He was previously prescribed Lexapro though did not start on this given concerns about starting the medication.  He does take finasteride for his hair.  This is prescribed to an online physician.   ROS: See pertinent positives and negatives per HPI.  No past medical history on file.  Past Surgical History:  Procedure Laterality Date  . TOOTH EXTRACTION  2008    Family History  Problem Relation Age of Onset  . Cancer Mother 56       breast ca  . Hypertension Mother   . Hypertension Father     SOCIAL HX:  nonsmoker   Current Outpatient Medications:  .  escitalopram (LEXAPRO) 10 MG tablet, Take 1 tablet (10 mg total) by mouth daily., Disp: 30 tablet, Rfl: 3 .  finasteride (PROSCAR) 5 MG tablet, Take 5 mg by mouth daily., Disp: , Rfl:  .  Multiple Vitamin (MULTIVITAMIN) tablet, Take 1 tablet by mouth daily., Disp: , Rfl:  .  UNABLE TO FIND, Med Name: Hims 6mL AntiAge Med (0.022%), Disp: , Rfl:   EXAM: This was a telehealth telephone visit and there was no physical exam completed.  ASSESSMENT AND PLAN:  Discussed the following assessment and plan:  Anxiety  Anxiety Worsened issues with this.  We will start him on Lexapro.  Discussed potential side effects of drowsiness, insomnia, sexual dysfunction, and worsening anxiety.  If these occur he will let us know.  Offered referral for therapy though he deferred this.  Follow-up in 2 months.  CMA will contact patient for follow-up to be scheduled in 2 months.  Social distancing precautions and sick precautions given regarding COVID-19.    I discussed the assessment and treatment plan with the patient. The patient was provided an opportunity to ask questions and all were answered. The patient agreed with the plan and demonstrated an understanding of the instructions.   The patient was advised to call back or seek an in-person evaluation if the symptoms worsen or if the condition fails to improve as anticipated.  I spent 12 minutes of non-face-to-face time on this encounter.  Marikay Alar,  MD

## 2018-06-05 NOTE — Telephone Encounter (Signed)
Unable to leave message for patient to return call back, VM box full. PEC may give and obtain information.  

## 2018-12-25 ENCOUNTER — Other Ambulatory Visit: Payer: Self-pay

## 2018-12-25 DIAGNOSIS — Z20822 Contact with and (suspected) exposure to covid-19: Secondary | ICD-10-CM

## 2018-12-29 LAB — NOVEL CORONAVIRUS, NAA: SARS-CoV-2, NAA: NOT DETECTED

## 2019-03-18 ENCOUNTER — Encounter: Payer: Self-pay | Admitting: Family Medicine

## 2019-03-18 ENCOUNTER — Other Ambulatory Visit: Payer: Self-pay

## 2019-03-18 ENCOUNTER — Ambulatory Visit (INDEPENDENT_AMBULATORY_CARE_PROVIDER_SITE_OTHER): Payer: Managed Care, Other (non HMO) | Admitting: Family Medicine

## 2019-03-18 VITALS — BP 125/80 | HR 84 | Temp 97.7°F | Ht 72.0 in | Wt 200.4 lb

## 2019-03-18 DIAGNOSIS — F322 Major depressive disorder, single episode, severe without psychotic features: Secondary | ICD-10-CM | POA: Insufficient documentation

## 2019-03-18 MED ORDER — SERTRALINE HCL 50 MG PO TABS: 50.0000 mg | ORAL_TABLET | Freq: Every day | ORAL | 3 refills | Status: DC

## 2019-03-18 NOTE — Patient Instructions (Addendum)
Nice to see. We will start on Zoloft.  If you develop any side effects that we discussed please let me know. I have also referred you for therapy.  If this is not affordable you could try counseling through your work. If you develop thoughts of harming yourself please seek medical attention immediately in the emergency room.

## 2019-03-18 NOTE — Assessment & Plan Note (Signed)
Patient with depression.  Significant elevation of PHQ-9 score.  Some thoughts of being better off not being here though no thoughts of killing himself.  He is interested in therapy and medication.  We will trial Zoloft.  I will refer for therapy.  He will check with his insurance to see if they cover therapy.  Advised if he ever had thoughts of killing himself he needs to go to the emergency room for evaluation.  Follow-up in 6 weeks.

## 2019-03-18 NOTE — Progress Notes (Signed)
  Marikay Alar, MD Phone: [phone]  Leon Shaw is a 34 y.o. male who presents today for same day visit.   Depression: Patient notes onset of depression recently.  He found out his girlfriend was pregnant on Valentine's Day.  She has an IUD and the pregnancy was unexpected.  The pregnancy is viable.  He has a 2 year old son as well that was also an unexpected pregnancy and he feels like he is reliving that episode with this.  He notes he has trouble eating and cannot concentrate.  He is not enjoying things like he used to.  He talked to those around him though he felt as though they were thinking that he was having an appropriate response to this.  He has had some thoughts that he would be better off not being here though has no suicidal thoughts.  He did take Lexapro previously for anxiety though he notes it made him feel weird and he had decreased libido with this.  Denies auditory and visual hallucinations.  Social History   Tobacco Use  Smoking Status Never Smoker  Smokeless Tobacco Never Used     ROS see history of present illness  Objective  Physical Exam Vitals:   03/18/19 1419  BP: 125/80  Pulse: 84  Temp: 97.7 F (36.5 C)  SpO2: 97%    BP Readings from Last 3 Encounters:  03/18/19 125/80  09/16/17 110/78  10/28/15 118/86   Wt Readings from Last 3 Encounters:  03/18/19 200 lb 6.4 oz (90.9 kg)  09/16/17 209 lb 9.6 oz (95.1 kg)  10/28/15 195 lb 12 oz (88.8 kg)    Physical Exam Constitutional:      General: He is not in acute distress.    Appearance: He is not diaphoretic.  Pulmonary:     Effort: Pulmonary effort is normal.  Neurological:     Mental Status: He is alert.  Psychiatric:     Comments: Mood depressed      Assessment/Plan: Please see individual problem list.  Depression, major, single episode, severe (HCC) Patient with depression.  Significant elevation of PHQ-9 score.  Some thoughts of being better off not being here though no  thoughts of killing himself.  He is interested in therapy and medication.  We will trial Zoloft.  I will refer for therapy.  He will check with his insurance to see if they cover therapy.  Advised if he ever had thoughts of killing himself he needs to go to the emergency room for evaluation.  Follow-up in 6 weeks.   Orders Placed This Encounter  Procedures  . Ambulatory referral to Psychology    Referral Priority:   Routine    Referral Type:   Psychiatric    Referral Reason:   Specialty Services Required    Requested Specialty:   Psychology    Number of Visits Requested:   1    Meds ordered this encounter  Medications  . sertraline (ZOLOFT) 50 MG tablet    Sig: Take 1 tablet (50 mg total) by mouth daily.    Dispense:  30 tablet    Refill:  3    This visit occurred during the SARS-CoV-2 public health emergency.  Safety protocols were in place, including screening questions prior to the visit, additional usage of staff PPE, and extensive cleaning of exam room while observing appropriate contact time as indicated for disinfecting solutions.    Marikay Alar, MD [facility]

## 2019-09-15 ENCOUNTER — Ambulatory Visit: Payer: Managed Care, Other (non HMO) | Admitting: Family Medicine

## 2019-09-15 ENCOUNTER — Other Ambulatory Visit: Payer: Self-pay

## 2019-09-15 ENCOUNTER — Encounter: Payer: Self-pay | Admitting: Family Medicine

## 2019-09-15 VITALS — BP 102/68 | HR 67 | Temp 97.9°F | Ht 72.01 in | Wt 210.4 lb

## 2019-09-15 DIAGNOSIS — Z13220 Encounter for screening for lipoid disorders: Secondary | ICD-10-CM

## 2019-09-15 DIAGNOSIS — Z0000 Encounter for general adult medical examination without abnormal findings: Secondary | ICD-10-CM | POA: Diagnosis not present

## 2019-09-15 DIAGNOSIS — Z130 Encounter for screening for diseases of the blood and blood-forming organs and certain disorders involving the immune mechanism: Secondary | ICD-10-CM | POA: Diagnosis not present

## 2019-09-15 DIAGNOSIS — E663 Overweight: Secondary | ICD-10-CM

## 2019-09-15 DIAGNOSIS — Z114 Encounter for screening for human immunodeficiency virus [HIV]: Secondary | ICD-10-CM

## 2019-09-15 DIAGNOSIS — Z1159 Encounter for screening for other viral diseases: Secondary | ICD-10-CM

## 2019-09-15 DIAGNOSIS — Z1329 Encounter for screening for other suspected endocrine disorder: Secondary | ICD-10-CM

## 2019-09-15 DIAGNOSIS — Z1152 Encounter for screening for COVID-19: Secondary | ICD-10-CM

## 2019-09-15 NOTE — Progress Notes (Signed)
Leon Rumps, MD Phone: [phone]  Leon Shaw is a 34 y.o. male who presents today for CPE.  Diet: Not great recently.  He does eat out at lunch.  Eats a lot of chicken though sometimes it is fried.  Does eat relatively healthfully at home with lots of vegetables.  Has 1 sweet tea per day. Exercise: Was lifting weights though has been on a couple week break since Covid got worse. Family history-  Prostate cancer: No  Colon cancer: No Sexually active: Yes with 1 male partner Vaccines-   Flu: He will get later in the season  Tetanus: Up-to-date  COVID19: Up-to-date HIV screening: Due Hep C Screening: Due Tobacco use: No Alcohol use: Rare Illicit Drug use: No Dentist: Yes Ophthalmology: Yes Patient is having a baby girl in October.   Active Ambulatory Problems    Diagnosis Date Noted  . Routine general medical examination at a health care facility 12/27/2014  . Anxiety 09/16/2017  . Contact dermatitis 09/16/2017  . Depression, major, single episode, severe ([place]) 03/18/2019   Resolved Ambulatory Problems    Diagnosis Date Noted  . Neck pain on left side 06/17/2013  . Hair loss 06/17/2013  . Costochondritis 12/10/2014  . Grief reaction 10/28/2015   No Additional Past Medical History    Family History  Problem Relation Age of Onset  . Cancer Mother 77       breast ca  . Hypertension Mother   . Hypertension Father     Social History   Socioeconomic History  . Marital status: Single    Spouse name: Not on file  . Number of children: Not on file  . Years of education: 60  . Highest education level: Not on file  Occupational History  . Occupation: Designer, television/film set: [employer]  Tobacco Use  . Smoking status: Never Smoker  . Smokeless tobacco: Never Used  Substance and Sexual Activity  . Alcohol use: Yes    Alcohol/week: 3.0 standard drinks    Types: 3 Cans of beer per week  . Drug use: No  . Sexual activity: Not on file  Other  Topics Concern  . Not on file  Social History Narrative   Born in [place], [place] and raised [place], [place]. Graduated from college and [employer] hired after college. Enjoys working out, listening to music, video games. Has one son, [name] (6 years). Currently private residence (apartment) and Caryl Pina (girlfriend).    Social Determinants of Health   Financial Resource Strain:   . Difficulty of Paying Living Expenses: Not on file  Food Insecurity:   . Worried About Charity fundraiser in the Last Year: Not on file  . Ran Out of Food in the Last Year: Not on file  Transportation Needs:   . Lack of Transportation (Medical): Not on file  . Lack of Transportation (Non-Medical): Not on file  Physical Activity:   . Days of Exercise per Week: Not on file  . Minutes of Exercise per Session: Not on file  Stress:   . Feeling of Stress : Not on file  Social Connections:   . Frequency of Communication with Friends and Family: Not on file  . Frequency of Social Gatherings with Friends and Family: Not on file  . Attends Religious Services: Not on file  . Active Member of Clubs or Organizations: Not on file  . Attends Archivist Meetings: Not on file  . Marital Status: Not on file  Intimate Partner Violence:   .  Fear of Current or Ex-Partner: Not on file  . Emotionally Abused: Not on file  . Physically Abused: Not on file  . Sexually Abused: Not on file    ROS  General:  Negative for nexplained weight loss, fever Skin: Negative for new or changing mole, sore that won't heal HEENT: Negative for trouble hearing, trouble seeing, ringing in ears, mouth sores, hoarseness, change in voice, dysphagia. CV:  Negative for chest pain, dyspnea, edema, palpitations Resp: Negative for cough, dyspnea, hemoptysis GI: Negative for nausea, vomiting, diarrhea, constipation, abdominal pain, melena, hematochezia. GU: Negative for dysuria, incontinence, urinary hesitance, hematuria, vaginal or penile  discharge, polyuria, sexual difficulty, lumps in testicle or breasts MSK: Negative for muscle cramps or aches, joint pain or swelling Neuro: Negative for headaches, weakness, numbness, dizziness, passing out/fainting Psych: Negative for depression, anxiety, memory problems  Objective  Physical Exam Vitals:   09/15/19 0904  BP: 102/68  Pulse: 67  Temp: 97.9 F (36.6 C)  SpO2: 97%    BP Readings from Last 3 Encounters:  09/15/19 102/68  03/18/19 125/80  09/16/17 110/78   Wt Readings from Last 3 Encounters:  09/15/19 210 lb 6.4 oz (95.4 kg)  03/18/19 200 lb 6.4 oz (90.9 kg)  09/16/17 209 lb 9.6 oz (95.1 kg)    Physical Exam Constitutional:      General: He is not in acute distress.    Appearance: He is not diaphoretic.  HENT:     Head: Normocephalic and atraumatic.  Eyes:     Conjunctiva/sclera: Conjunctivae normal.     Pupils: Pupils are equal, round, and reactive to light.  Cardiovascular:     Rate and Rhythm: Normal rate and regular rhythm.     Heart sounds: Normal heart sounds.  Pulmonary:     Effort: Pulmonary effort is normal.     Breath sounds: Normal breath sounds.  Abdominal:     General: Bowel sounds are normal. There is no distension.     Palpations: Abdomen is soft.     Tenderness: There is no abdominal tenderness. There is no guarding or rebound.  Musculoskeletal:     Right lower leg: No edema.     Left lower leg: No edema.  Lymphadenopathy:     Cervical: No cervical adenopathy.  Skin:    General: Skin is warm and dry.  Neurological:     Mental Status: He is alert.  Psychiatric:        Mood and Affect: Mood normal.      Assessment/Plan:   Routine general medical examination at a health care facility Physical exam completed.  Encouraged healthy diet.  Discussed decreasing fried food and sweet tea intake.  Encouraged him to get back to exercising though discussed it may be best to exercise at home given the COVID-19 pandemic.  Hepatitis C and  HIV screening will be obtained.  Encouraged him to get a flu vaccine later in the season.  Encouraged continued safe social distancing with the Covid pandemic.  Other lab work as outlined below.   Orders Placed This Encounter  Procedures  . Comp Met (CMET)  . Lipid panel  . HgB A1c  . CBC  . TSH  . SARS-CoV-2 Semi-Quantitative Total Antibody, Spike    Order Specific Question:   Is this test for diagnosis or screening    Answer:   Screening    Order Specific Question:   Symptomatic for COVID-19 as defined by CDC    Answer:   No  Order Specific Question:   Hospitalized for COVID-19    Answer:   No    Order Specific Question:   Admitted to ICU for COVID-19    Answer:   No    Order Specific Question:   Previously tested for COVID-19    Answer:   Yes    Order Specific Question:   Resident in a congregate (group) care setting    Answer:   No    Order Specific Question:   Employed in healthcare setting    Answer:   No  . Hepatitis C Antibody  . HIV antibody (with reflex)    No orders of the defined types were placed in this encounter.   This visit occurred during the SARS-CoV-2 public health emergency.  Safety protocols were in place, including screening questions prior to the visit, additional usage of staff PPE, and extensive cleaning of exam room while observing appropriate contact time as indicated for disinfecting solutions.    Leon Rumps, MD [place] [facility]

## 2019-09-15 NOTE — Assessment & Plan Note (Signed)
Physical exam completed.  Encouraged healthy diet.  Discussed decreasing fried food and sweet tea intake.  Encouraged him to get back to exercising though discussed it may be best to exercise at home given the COVID-19 pandemic.  Hepatitis C and HIV screening will be obtained.  Encouraged him to get a flu vaccine later in the season.  Encouraged continued safe social distancing with the Covid pandemic.  Other lab work as outlined below.

## 2019-09-15 NOTE — Patient Instructions (Signed)
Nice to see you. Please try to add back in some exercise.  Please decrease your fried food intake and sweet tea intake. We will get lab work today and contact you with the results.

## 2019-09-16 ENCOUNTER — Telehealth: Payer: Self-pay | Admitting: Family Medicine

## 2019-09-16 LAB — LIPID PANEL
Chol/HDL Ratio: 3 ratio (ref 0.0–5.0)
Cholesterol, Total: 158 mg/dL (ref 100–199)
HDL: 52 mg/dL (ref >39)
LDL Chol Calc (NIH): 91 mg/dL (ref 0–99)
Triglycerides: 76 mg/dL (ref 0–149)
VLDL Cholesterol Cal: 15 mg/dL (ref 5–40)

## 2019-09-16 LAB — COMPREHENSIVE METABOLIC PANEL
ALT: 37 IU/L (ref 0–44)
AST: 23 IU/L (ref 0–40)
Albumin/Globulin Ratio: 1.7 (ref 1.2–2.2)
Albumin: 4.5 g/dL (ref 4.0–5.0)
Alkaline Phosphatase: 68 IU/L (ref 48–121)
BUN/Creatinine Ratio: 12 (ref 9–20)
BUN: 13 mg/dL (ref 6–20)
Bilirubin Total: 1.2 mg/dL (ref 0.0–1.2)
CO2: 24 mmol/L (ref 20–29)
Calcium: 9.2 mg/dL (ref 8.7–10.2)
Chloride: 106 mmol/L (ref 96–106)
Creatinine, Ser: 1.05 mg/dL (ref 0.76–1.27)
GFR calc Af Amer: 106 mL/min/{1.73_m2} (ref >59)
GFR calc non Af Amer: 92 mL/min/{1.73_m2} (ref >59)
Globulin, Total: 2.7 g/dL (ref 1.5–4.5)
Glucose: 93 mg/dL (ref 65–99)
Potassium: 4.5 mmol/L (ref 3.5–5.2)
Sodium: 142 mmol/L (ref 134–144)
Total Protein: 7.2 g/dL (ref 6.0–8.5)

## 2019-09-16 LAB — CBC
Hematocrit: 43 % (ref 37.5–51.0)
Hemoglobin: 14.7 g/dL (ref 13.0–17.7)
MCH: 30.1 pg (ref 26.6–33.0)
MCHC: 34.2 g/dL (ref 31.5–35.7)
MCV: 88 fL (ref 79–97)
Platelets: 234 10*3/uL (ref 150–450)
RBC: 4.88 x10E6/uL (ref 4.14–5.80)
RDW: 12.1 % (ref 11.6–15.4)
WBC: 4.8 10*3/uL (ref 3.4–10.8)

## 2019-09-16 LAB — SARS-COV-2 SEMI-QUANTITATIVE TOTAL ANTIBODY, SPIKE
SARS-CoV-2 Semi-Quant Total Ab: 1613 U/mL (ref <0.8)
SARS-CoV-2 Spike Ab Interp: POSITIVE

## 2019-09-16 LAB — HEMOGLOBIN A1C
Est. average glucose Bld gHb Est-mCnc: 111 mg/dL
Hgb A1c MFr Bld: 5.5 % (ref 4.8–5.6)

## 2019-09-16 LAB — HIV ANTIBODY (ROUTINE TESTING W REFLEX): HIV Screen 4th Generation wRfx: NONREACTIVE

## 2019-09-16 LAB — TSH: TSH: 0.915 u[IU]/mL (ref 0.450–4.500)

## 2019-09-16 LAB — HEPATITIS C ANTIBODY: Hep C Virus Ab: <0.1 s/co ratio (ref 0.0–0.9)

## 2019-09-16 NOTE — Telephone Encounter (Signed)
Physical form for work has been completed and faxed to [phone]. Fax confirmation has been received.   No answer, no voicemail when calling patient to come in to pick up physical copy. Mychart message sent to inform the patient and form placed back on Nina's desk.

## 2020-01-27 ENCOUNTER — Encounter: Payer: Self-pay | Admitting: Family Medicine

## 2020-01-29 ENCOUNTER — Other Ambulatory Visit (INDEPENDENT_AMBULATORY_CARE_PROVIDER_SITE_OTHER): Payer: Managed Care, Other (non HMO)

## 2020-01-29 ENCOUNTER — Other Ambulatory Visit: Payer: Managed Care, Other (non HMO)

## 2020-01-29 ENCOUNTER — Other Ambulatory Visit: Payer: Self-pay | Admitting: Family Medicine

## 2020-01-29 DIAGNOSIS — R059 Cough, unspecified: Secondary | ICD-10-CM

## 2020-01-29 LAB — POCT INFLUENZA A/B
Influenza A, POC: NEGATIVE
Influenza B, POC: NEGATIVE

## 2020-01-29 NOTE — Telephone Encounter (Signed)
Orders placed.

## 2020-02-03 ENCOUNTER — Telehealth (INDEPENDENT_AMBULATORY_CARE_PROVIDER_SITE_OTHER): Payer: Managed Care, Other (non HMO) | Admitting: Family Medicine

## 2020-02-03 ENCOUNTER — Encounter: Payer: Self-pay | Admitting: Family Medicine

## 2020-02-03 ENCOUNTER — Telehealth: Payer: Self-pay

## 2020-02-03 DIAGNOSIS — J069 Acute upper respiratory infection, unspecified: Secondary | ICD-10-CM | POA: Diagnosis not present

## 2020-02-03 LAB — NOVEL CORONAVIRUS, NAA: SARS-CoV-2, NAA: NOT DETECTED

## 2020-02-03 MED ORDER — CETIRIZINE HCL 10 MG PO TABS: 10.0000 mg | ORAL_TABLET | Freq: Every day | ORAL | 0 refills | Status: AC

## 2020-02-03 MED ORDER — FLUTICASONE PROPIONATE 50 MCG/ACT NA SUSP: 2.0000 | Freq: Every day | NASAL | 0 refills | Status: DC

## 2020-02-03 NOTE — Assessment & Plan Note (Signed)
Suspect viral upper respiratory infection.  Flu test and COVID test were both negative though I did discuss there is the potential for false negatives with those.  At this point he has been improving and meets criteria to come off of quarantine though I did encourage him to wear a mask if he is around others and also stay home to be safe as he does have the time off from work.  Discussed that his symptoms will likely progressively improve over the next several days.  Discussed Flonase and Zyrtec for symptom management.  If he worsens again or if he does not continue to improve he will let us know.

## 2020-02-03 NOTE — Progress Notes (Signed)
Virtual Visit via telephone Note  This visit type was conducted due to national recommendations for restrictions regarding the COVID-19 pandemic (e.g. social distancing).  This format is felt to be most appropriate for this patient at this time.  All issues noted in this document were discussed and addressed.  No physical exam was performed (except for noted visual exam findings with Video Visits).   I connected with Leon Shaw today at  2:15 PM EST by telephone and verified that I am speaking with the correct person using two identifiers. Location patient: home Location provider: work Persons participating in the virtual visit: patient, provider  I discussed the limitations, risks, security and privacy concerns of performing an evaluation and management service by telephone and the availability of in person appointments. I also discussed with the patient that there may be a patient responsible charge related to this service. The patient expressed understanding and agreed to proceed.  Interactive audio and video telecommunications were attempted between this provider and patient, however failed, due to patient having technical difficulties OR patient did not have access to video capability.  We continued and completed visit with audio only.   Reason for visit: same day visit  HPI: Respiratory illness: Onset on 01/26/2020.  Started with headache, congestion, body aches, rhinorrhea, fevers of 100.9 F, and chills.  Overall now feels like he has been improving though does still have some congestion.  Cough-this has improved  Congestion-yes   Sinus-improving in his sinuses, blowing mostly clear mucus out   Chest-no  Post nasal drip-yes  Sore throat-no  Shortness of breath-no other did report fatigue when he would get up and move around  Fever-none currently  Taste disturbance-no  Smell disturbance-no  Covid exposure-no  Covid vaccination-no  Covid booster-no  Medications-Mucinex DM,  Tylenol    ROS: See pertinent positives and negatives per HPI.  No past medical history on file.  Past Surgical History:  Procedure Laterality Date  . TOOTH EXTRACTION  2008    Family History  Problem Relation Age of Onset  . Cancer Mother 88       breast ca  . Hypertension Mother   . Hypertension Father     SOCIAL HX: Non-smoker   Current Outpatient Medications:  .  cetirizine (ZYRTEC) 10 MG tablet, Take 1 tablet (10 mg total) by mouth daily., Disp: 30 tablet, Rfl: 0 .  finasteride (PROSCAR) 5 MG tablet, Take 5 mg by mouth daily., Disp: , Rfl:  .  fluticasone (FLONASE) 50 MCG/ACT nasal spray, Place 2 sprays into both nostrils daily., Disp: 16 g, Rfl: 0 .  Multiple Vitamin (MULTIVITAMIN) tablet, Take 1 tablet by mouth daily., Disp: , Rfl:  .  sertraline (ZOLOFT) 50 MG tablet, Take 1 tablet (50 mg total) by mouth daily., Disp: 30 tablet, Rfl: 3 .  UNABLE TO FIND, Med Name: Hims 24mL AntiAge Med (0.022%), Disp: , Rfl:   EXAM: This was a telephone visit and thus no physical exam was completed.  ASSESSMENT AND PLAN:  Discussed the following assessment and plan:  Problem List Items Addressed This Visit    Upper respiratory infection    Suspect viral upper respiratory infection.  Flu test and COVID test were both negative though I did discuss there is the potential for false negatives with those.  At this point he has been improving and meets criteria to come off of quarantine though I did encourage him to wear a mask if he is around others and also stay home  to be safe as he does have the time off from work.  Discussed that his symptoms will likely progressively improve over the next several days.  Discussed Flonase and Zyrtec for symptom management.  If he worsens again or if he does not continue to improve he will let us know.          I discussed the assessment and treatment plan with the patient. The patient was provided an opportunity to ask questions and all were  answered. The patient agreed with the plan and demonstrated an understanding of the instructions.   The patient was advised to call back or seek an in-person evaluation if the symptoms worsen or if the condition fails to improve as anticipated.  I provided 9 minutes of non-face-to-face time during this encounter.   Marikay Alar, MD

## 2020-02-03 NOTE — Telephone Encounter (Signed)
I called and spoke with the patient and scheduled a virtual visit with the patient for today per the provider.  Ronie Barnhart,cma

## 2020-02-12 ENCOUNTER — Telehealth: Payer: Self-pay

## 2020-02-12 NOTE — Telephone Encounter (Signed)
Letter sent to patients mychart. Please make sure he got it.

## 2020-02-12 NOTE — Telephone Encounter (Signed)
I called and lVM informing the patient that the letter was in [place].  Deangleo Passage,cma

## 2020-09-14 ENCOUNTER — Encounter: Payer: Self-pay | Admitting: Family Medicine

## 2020-09-14 ENCOUNTER — Other Ambulatory Visit: Payer: Self-pay

## 2020-09-14 ENCOUNTER — Ambulatory Visit (INDEPENDENT_AMBULATORY_CARE_PROVIDER_SITE_OTHER): Payer: 59 | Admitting: Family Medicine

## 2020-09-14 VITALS — BP 121/80 | HR 69 | Temp 98.3°F | Ht 73.0 in | Wt 204.0 lb

## 2020-09-14 DIAGNOSIS — S39012A Strain of muscle, fascia and tendon of lower back, initial encounter: Secondary | ICD-10-CM | POA: Diagnosis not present

## 2020-09-14 DIAGNOSIS — Z13220 Encounter for screening for lipoid disorders: Secondary | ICD-10-CM | POA: Diagnosis not present

## 2020-09-14 DIAGNOSIS — E663 Overweight: Secondary | ICD-10-CM | POA: Diagnosis not present

## 2020-09-14 DIAGNOSIS — Z0001 Encounter for general adult medical examination with abnormal findings: Secondary | ICD-10-CM

## 2020-09-14 LAB — COMPREHENSIVE METABOLIC PANEL
ALT: 37 U/L (ref 0–53)
AST: 23 U/L (ref 0–37)
Albumin: 4.2 g/dL (ref 3.5–5.2)
Alkaline Phosphatase: 65 U/L (ref 39–117)
BUN: 15 mg/dL (ref 6–23)
CO2: 25 mEq/L (ref 19–32)
Calcium: 9.4 mg/dL (ref 8.4–10.5)
Chloride: 106 mEq/L (ref 96–112)
Creatinine, Ser: 1.21 mg/dL (ref 0.40–1.50)
GFR: 76.86 mL/min (ref >60.00)
Glucose, Bld: 77 mg/dL (ref 70–99)
Potassium: 4 mEq/L (ref 3.5–5.1)
Sodium: 141 mEq/L (ref 135–145)
Total Bilirubin: 1 mg/dL (ref 0.2–1.2)
Total Protein: 7 g/dL (ref 6.0–8.3)

## 2020-09-14 LAB — LIPID PANEL
Cholesterol: 128 mg/dL (ref 0–200)
HDL: 46.9 mg/dL (ref >39.00)
LDL Cholesterol: 67 mg/dL (ref 0–99)
NonHDL: 81.06
Total CHOL/HDL Ratio: 3
Triglycerides: 72 mg/dL (ref 0.0–149.0)
VLDL: 14.4 mg/dL (ref 0.0–40.0)

## 2020-09-14 LAB — HEMOGLOBIN A1C: Hgb A1c MFr Bld: 5.6 % (ref 4.6–6.5)

## 2020-09-14 NOTE — Patient Instructions (Signed)
Nice to see you. We will get labs today.  Please take it easy with the weight lifting for a couple of weeks. If your back does not improve please let me know.  If you have worsening back pain, develop numbness, weakness, or loss of bowel or bladder function please get evaluated quickly.

## 2020-09-14 NOTE — Assessment & Plan Note (Signed)
Physical exam completed.  I encouraged continued healthy diet and exercise.  Discussed prostate cancer screening starting at age 37.  Discussed colon cancer screening starting at age 61.  I encouraged him to get the flu vaccine when he is ready to do so.  Discussed COVID booster and I encouraged him to get this as well.  Lab work as outlined.

## 2020-09-14 NOTE — Assessment & Plan Note (Signed)
I suspect he has had a muscular strain.  He is neurologically intact.  I discussed adequate rest from lifting weights for the next couple of weeks.  Discussed risk of reinjuring himself if he goes back to exercising too soon.  Discussed remaining active though just not lifting any weights during this timeframe.  If he has worsening symptoms he will let us know.  He is given red flag symptoms to seek medical attention for.

## 2020-09-14 NOTE — Progress Notes (Signed)
Tommi Rumps, MD Phone: [phone]  Leon Shaw is a 35 y.o. male who presents today for CPE.  Diet: generally healthy Exercise: gym 5x/week Colonoscopy: not indicated Prostate cancer screening: not indicated Family history-  Prostate cancer: cousin in his 23s  Colon cancer: no Vaccines-   Flu: will get later in the season  Tetanus: UTD  COVID19: x2 HIV screening: UTD Hep C Screening: UTD Tobacco use: no Alcohol use: occasional Illicit Drug use: no Dentist: yes Ophthalmology: yes  Back pain: Patient notes he was doing dead lifts a couple of days ago and felt a twinge in his back.  He felt as though it was muscular.  He was lifting 315 pounds.  Now it is only bothering him when he bends over or twists a certain way.  No radiation.  No numbness, weakness, or incontinence.  He feels as though this is progressively improving.   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/18/2019   Upper respiratory infection 02/03/2020   Low back strain 09/14/2020   Resolved Ambulatory Problems    Diagnosis Date Noted   Neck pain on left side 06/17/2013   Hair loss 06/17/2013   Costochondritis 12/10/2014   Grief reaction 10/28/2015   No Additional Past Medical History    Family History  Problem Relation Age of Onset   Cancer Mother 82       breast ca   Hypertension Mother    Hypertension Father     Social History   Socioeconomic History   Marital status: Single    Spouse name: Not on file   Number of children: Not on file   Years of education: 16   Highest education level: Not on file  Occupational History   Occupation: Designer, television/film set: [employer]  Tobacco Use   Smoking status: Never   Smokeless tobacco: Never  Substance and Sexual Activity    Alcohol use: Yes    Alcohol/week: 3.0 standard drinks    Types: 3 Cans of beer per week   Drug use: No   Sexual activity: Not on file  Other Topics Concern   Not on file  Social History Narrative   Born in [place], [place] and raised [place], [place]. Graduated from college and [employer] hired after college. Enjoys working out, listening to music, video games. Has one son, [name] (6 years). Currently private residence (apartment) and Caryl Pina (girlfriend).    Social Determinants of Health   Financial Resource Strain: Not on file  Food Insecurity: Not on file  Transportation Needs: Not on file  Physical Activity: Not on file  Stress: Not on file  Social Connections: Not on file  Intimate Partner Violence: Not on file    ROS  General:  Negative for nexplained weight loss, fever Skin: Negative for new or changing mole, sore that won't heal HEENT: Negative for trouble hearing, trouble seeing, ringing in ears, mouth sores, hoarseness, change in voice, dysphagia. CV:  Negative for chest pain, dyspnea, edema, palpitations Resp: Negative for cough, dyspnea, hemoptysis GI: Negative for nausea, vomiting, diarrhea, constipation, abdominal pain, melena, hematochezia. GU: Negative for dysuria, incontinence, urinary hesitance, hematuria, vaginal or penile discharge, polyuria, sexual difficulty, lumps in testicle or breasts MSK: Negative for muscle cramps or aches, joint pain or swelling Neuro: Negative for headaches, weakness, numbness, dizziness, passing out/fainting  Psych: Negative for depression, anxiety, memory problems  Objective  Physical Exam Vitals:   09/14/20 0946  BP: 121/80  Pulse: 69  Temp: 98.3 F (36.8 C)  SpO2: 98%    BP Readings from Last 3 Encounters:  09/14/20 121/80  09/15/19 102/68  03/18/19 125/80   Wt Readings from Last 3 Encounters:  09/14/20 204 lb (92.5 kg)  02/03/20 205 lb (93 kg)  09/15/19 210 lb 6.4 oz (95.4 kg)    Physical Exam Constitutional:       General: He is not in acute distress.    Appearance: He is not diaphoretic.  HENT:     Head: Normocephalic and atraumatic.  Eyes:     Conjunctiva/sclera: Conjunctivae normal.     Pupils: Pupils are equal, round, and reactive to light.  Cardiovascular:     Rate and Rhythm: Normal rate and regular rhythm.     Heart sounds: Normal heart sounds.  Pulmonary:     Effort: Pulmonary effort is normal.     Breath sounds: Normal breath sounds.  Abdominal:     General: Bowel sounds are normal. There is no distension.     Palpations: Abdomen is soft.     Tenderness: There is no abdominal tenderness. There is no guarding or rebound.  Musculoskeletal:     Right lower leg: No edema.     Left lower leg: No edema.     Comments: No midline spine tenderness, no midline spine step-off, no muscular back tenderness  Skin:    General: Skin is warm and dry.  Neurological:     Mental Status: He is alert.     Comments: 5/5 strength bilateral quads, hamstrings, plantar flexion, and dorsiflexion, sensation to light touch intact bilateral lower extremities, 2+ patellar reflexes  Psychiatric:        Mood and Affect: Mood normal.     Assessment/Plan:   Problem List Items Addressed This Visit     Encounter for general adult medical examination with abnormal findings    Physical exam completed.  I encouraged continued healthy diet and exercise.  Discussed prostate cancer screening starting at age 37.  Discussed colon cancer screening starting at age 37.  I encouraged him to get the flu vaccine when he is ready to do so.  Discussed COVID booster and I encouraged him to get this as well.  Lab work as outlined.      Low back strain    I suspect he has had a muscular strain.  He is neurologically intact.  I discussed adequate rest from lifting weights for the next couple of weeks.  Discussed risk of reinjuring himself if he goes back to exercising too soon.  Discussed remaining active though just not lifting any  weights during this timeframe.  If he has worsening symptoms he will let us know.  He is given red flag symptoms to seek medical attention for.      Other Visit Diagnoses     Lipid screening    -  Primary   Relevant Orders   Comp Met (CMET)   Lipid panel   Overweight       Relevant Orders   HgB A1c       Return in about 1 year (around 09/14/2021) for CPE.  This visit occurred during the SARS-CoV-2 public health emergency.  Safety protocols were in place, including screening questions prior to the visit, additional usage of staff PPE, and extensive cleaning of exam room while observing appropriate contact time  as indicated for disinfecting solutions.    Tommi Rumps, MD [facility]

## 2020-12-09 ENCOUNTER — Telehealth: Payer: Self-pay | Admitting: Family Medicine

## 2020-12-09 ENCOUNTER — Telehealth (INDEPENDENT_AMBULATORY_CARE_PROVIDER_SITE_OTHER): Payer: 59 | Admitting: Family Medicine

## 2020-12-09 DIAGNOSIS — U071 COVID-19: Secondary | ICD-10-CM

## 2020-12-09 MED ORDER — BENZONATATE 200 MG PO CAPS: 200.0000 mg | ORAL_CAPSULE | Freq: Two times a day (BID) | ORAL | 0 refills | Status: DC | PRN

## 2020-12-09 NOTE — Telephone Encounter (Signed)
I called and the VM was full, I called on his home and mobile number, since I could not reach him I sent him a mychart message to see if he would do a virtual visit today at 4;30 per provider.  Christee Mervine,cma

## 2020-12-09 NOTE — Telephone Encounter (Signed)
Please call the patient.  He sent a request for an appointment given that he recently tested positive for COVID.  It would generally be a good idea for him to complete some kind of evaluation to determine what medication would be appropriate.  This could be with any available provider. If there is not any thing available please let me know.

## 2020-12-09 NOTE — Progress Notes (Signed)
Virtual Visit via video Note  This visit type was conducted due to national recommendations for restrictions regarding the COVID-19 pandemic (e.g. social distancing).  This format is felt to be most appropriate for this patient at this time.  All issues noted in this document were discussed and addressed.  No physical exam was performed (except for noted visual exam findings with Video Visits).   I connected with Leon Shaw today at  4:30 PM EST by a video enabled telemedicine application and verified that I am speaking with the correct person using two identifiers. Location patient: home Location provider: work  Persons participating in the virtual visit: patient, provider  I discussed the limitations, risks, security and privacy concerns of performing an evaluation and management service by telephone and the availability of in person appointments. I also discussed with the patient that there may be a patient responsible charge related to this service. The patient expressed understanding and agreed to proceed.  Reason for visit: Same-day visit.  HPI: COVID-19: Patient notes symptoms started on 12/06/2020.  He tested positive the same day.  Started feeling poorly with fevers and chills.  Temp of 100.3 F.  He felt achy and had headaches.  He notes congestion in his sinuses.  He notes abnormal smell.  Has decreased appetite.  Does note sore throat and postnasal drip.  No shortness of breath.  He is only been taking Tylenol.   ROS: See pertinent positives and negatives per HPI.  No past medical history on file.  Past Surgical History:  Procedure Laterality Date   TOOTH EXTRACTION  2008    Family History  Problem Relation Age of Onset   Cancer Mother 59       breast ca   Hypertension Mother    Hypertension Father     SOCIAL HX: Non-smoker.   Current Outpatient Medications:    benzonatate (TESSALON) 200 MG capsule, Take 1 capsule (200 mg total) by mouth 2 (two) times daily  as needed for cough., Disp: 20 capsule, Rfl: 0   cetirizine (ZYRTEC) 10 MG tablet, Take 1 tablet (10 mg total) by mouth daily., Disp: 30 tablet, Rfl: 0   finasteride (PROSCAR) 5 MG tablet, Take 5 mg by mouth daily., Disp: , Rfl:    fluticasone (FLONASE) 50 MCG/ACT nasal spray, Place 2 sprays into both nostrils daily., Disp: 16 g, Rfl: 0   Multiple Vitamin (MULTIVITAMIN) tablet, Take 1 tablet by mouth daily., Disp: , Rfl:    sertraline (ZOLOFT) 50 MG tablet, Take 1 tablet (50 mg total) by mouth daily., Disp: 30 tablet, Rfl: 3   UNABLE TO FIND, Med Name: Hims 84mL AntiAge Med (0.022%), Disp: , Rfl:   EXAM:  VITALS per patient if applicable:  GENERAL: alert, oriented, appears well and in no acute distress  HEENT: atraumatic, conjunttiva clear, no obvious abnormalities on inspection of external nose and ears  NECK: normal movements of the head and neck  LUNGS: on inspection no signs of respiratory distress, breathing rate appears normal, no obvious gross SOB, gasping or wheezing  CV: no obvious cyanosis  MS: moves all visible extremities without noticeable abnormality  PSYCH/NEURO: pleasant and cooperative, no obvious depression or anxiety, speech and thought processing grossly intact  ASSESSMENT AND PLAN:  Discussed the following assessment and plan:  Problem List Items Addressed This Visit     COVID-19    Patient is well-appearing and seems to be doing relatively well.  He is low risk for any severe complications.  Discussed  that he would not meet criteria for oral antiviral treatments.  Discussed that he should likely improve over the next several days.  Discussed staying quarantined at home through 12/11/2020.  Discussed wearing a mask from 12/12/2020 through 12/16/2020.  Discussed reasons to seek medical attention.  He will let us know if not improving.  Discussed continued Tylenol use for any discomfort.  Discussed use of Flonase and Zyrtec over-the-counter for help with  congestion.       Return if symptoms worsen or fail to improve.   I discussed the assessment and treatment plan with the patient. The patient was provided an opportunity to ask questions and all were answered. The patient agreed with the plan and demonstrated an understanding of the instructions.   The patient was advised to call back or seek an in-person evaluation if the symptoms worsen or if the condition fails to improve as anticipated.   Marikay Alar, MD

## 2020-12-12 DIAGNOSIS — U071 COVID-19: Secondary | ICD-10-CM | POA: Insufficient documentation

## 2020-12-12 NOTE — Telephone Encounter (Signed)
Patient had a virtual visit with provider.  Magenta Schmiesing,cma

## 2020-12-12 NOTE — Assessment & Plan Note (Signed)
Patient is well-appearing and seems to be doing relatively well.  He is low risk for any severe complications.  Discussed that he would not meet criteria for oral antiviral treatments.  Discussed that he should likely improve over the next several days.  Discussed staying quarantined at home through 12/11/2020.  Discussed wearing a mask from 12/12/2020 through 12/16/2020.  Discussed reasons to seek medical attention.  He will let us know if not improving.  Discussed continued Tylenol use for any discomfort.  Discussed use of Flonase and Zyrtec over-the-counter for help with congestion.

## 2021-05-25 ENCOUNTER — Encounter: Payer: Self-pay | Admitting: Family Medicine

## 2021-06-28 ENCOUNTER — Telehealth (INDEPENDENT_AMBULATORY_CARE_PROVIDER_SITE_OTHER): Payer: 59 | Admitting: Family Medicine

## 2021-06-28 ENCOUNTER — Encounter: Payer: Self-pay | Admitting: Family Medicine

## 2021-06-28 DIAGNOSIS — F419 Anxiety disorder, unspecified: Secondary | ICD-10-CM

## 2021-06-28 DIAGNOSIS — J069 Acute upper respiratory infection, unspecified: Secondary | ICD-10-CM | POA: Diagnosis not present

## 2021-06-28 DIAGNOSIS — M5412 Radiculopathy, cervical region: Secondary | ICD-10-CM | POA: Insufficient documentation

## 2021-06-28 DIAGNOSIS — R200 Anesthesia of skin: Secondary | ICD-10-CM | POA: Diagnosis not present

## 2021-06-28 MED ORDER — SERTRALINE HCL 50 MG PO TABS: 50.0000 mg | ORAL_TABLET | Freq: Every day | ORAL | 3 refills | Status: DC

## 2021-06-28 MED ORDER — FLUTICASONE PROPIONATE 50 MCG/ACT NA SUSP: 2.0000 | Freq: Every day | NASAL | 0 refills | Status: DC

## 2021-06-28 NOTE — Assessment & Plan Note (Signed)
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  We will start his work-up with a x-ray of his neck.  Further management will be determined by x-ray results.

## 2021-06-28 NOTE — Progress Notes (Signed)
Virtual Visit via video Note  This visit type was conducted due to national recommendations for restrictions regarding the COVID-19 pandemic (e.g. social distancing).  This format is felt to be most appropriate for this patient at this time.  All issues noted in this document were discussed and addressed.  No physical exam was performed (except for noted visual exam findings with Video Visits).   I connected with Desma Maxim today at  4:30 PM EDT by a video enabled telemedicine application or telephone and verified that I am speaking with the correct person using two identifiers. Location patient: car Location provider: work Persons participating in the virtual visit: patient, provider  I discussed the limitations, risks, security and privacy concerns of performing an evaluation and management service by telephone and the availability of in person appointments. I also discussed with the patient that there may be a patient responsible charge related to this service. The patient expressed understanding and agreed to proceed.  Reason for visit: same day visit.  HPI: Anxiety: Patient notes his anxiety is worsened recently.  He has found himself worrying about nothing in particular.  He notes in the past Zoloft was helpful.  He notes no depression or SI.  No alcohol intake.  Left arm numbness: Patient reports numbness in his left arm in the C7 distribution.  Notes it is intermittent.  No injury.  No neck pain.  No chest pain.  No elbow pain.  Upper respiratory infection: Patient notes this started 7 or 8 days ago.  He had postnasal drip and cough with nasal congestion.  He notes he is doing significantly better though does continue to have some nasal congestion and is blowing yellow mucus out of his nose.  He did try an over-the-counter allergy pill.   ROS: See pertinent positives and negatives per HPI.  No past medical history on file.  Past Surgical History:  Procedure Laterality Date    TOOTH EXTRACTION  2008    Family History  Problem Relation Age of Onset   Cancer Mother 34       breast ca   Hypertension Mother    Hypertension Father     SOCIAL HX: Non-smoker   Current Outpatient Medications:    benzonatate (TESSALON) 200 MG capsule, Take 1 capsule (200 mg total) by mouth 2 (two) times daily as needed for cough., Disp: 20 capsule, Rfl: 0   cetirizine (ZYRTEC) 10 MG tablet, Take 1 tablet (10 mg total) by mouth daily., Disp: 30 tablet, Rfl: 0   finasteride (PROSCAR) 5 MG tablet, Take 5 mg by mouth daily., Disp: , Rfl:    fluticasone (FLONASE) 50 MCG/ACT nasal spray, Place 2 sprays into both nostrils daily., Disp: 16 g, Rfl: 0   Multiple Vitamin (MULTIVITAMIN) tablet, Take 1 tablet by mouth daily., Disp: , Rfl:    sertraline (ZOLOFT) 50 MG tablet, Take 1 tablet (50 mg total) by mouth daily., Disp: 30 tablet, Rfl: 3   UNABLE TO FIND, Med Name: Hims 16mL AntiAge Med (0.022%), Disp: , Rfl:   EXAM:  VITALS per patient if applicable:  GENERAL: alert, oriented, appears well and in no acute distress  HEENT: atraumatic, conjunttiva clear, no obvious abnormalities on inspection of external nose and ears  NECK: normal movements of the head and neck  LUNGS: on inspection no signs of respiratory distress, breathing rate appears normal, no obvious gross SOB, gasping or wheezing  CV: no obvious cyanosis  MS: moves all visible extremities without noticeable abnormality  PSYCH/NEURO:  pleasant and cooperative, no obvious depression or anxiety, speech and thought processing grossly intact  ASSESSMENT AND PLAN:  Discussed the following assessment and plan:  Problem List Items Addressed This Visit     Anxiety (Chronic)    Worsened recently.  We will trial Zoloft 50 mg once daily.  Discussed the risk of sleep issues, weight gain, and sexual dysfunction.  Advised that his anxiety could potentially get slightly worse prior to getting better while taking this medication.   He will contact us if his anxiety gets significantly worse while on the medication.  Follow-up in 6 weeks.       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  We will start his work-up with a x-ray of his neck.  Further management will be determined by x-ray results.       Upper respiratory infection    Patient has progressively been recovering.  At this point he does not have any sinus congestion to indicate a sinusitis.  He will monitor for the next several days and try Flonase.  If he worsens at all he will let us know.  If he is not improving he will let us know.        Return in about 1 week (around 07/05/2021) for neck x-ray, 6 weeks with PCP.   I discussed the assessment and treatment plan with the patient. The patient was provided an opportunity to ask questions and all were answered. The patient agreed with the plan and demonstrated an understanding of the instructions.   The patient was advised to call back or seek an in-person evaluation if the symptoms worsen or if the condition fails to improve as anticipated.    Marikay Alar, MD

## 2021-06-28 NOTE — Assessment & Plan Note (Signed)
Patient has progressively been recovering.  At this point he does not have any sinus congestion to indicate a sinusitis.  He will monitor for the next several days and try Flonase.  If he worsens at all he will let us know.  If he is not improving he will let us know.

## 2021-06-28 NOTE — Assessment & Plan Note (Signed)
Worsened recently.  We will trial Zoloft 50 mg once daily.  Discussed the risk of sleep issues, weight gain, and sexual dysfunction.  Advised that his anxiety could potentially get slightly worse prior to getting better while taking this medication.  He will contact us if his anxiety gets significantly worse while on the medication.  Follow-up in 6 weeks.

## 2021-07-02 ENCOUNTER — Encounter: Payer: Self-pay | Admitting: Family Medicine

## 2021-07-03 ENCOUNTER — Encounter: Payer: Self-pay | Admitting: Internal Medicine

## 2021-07-03 ENCOUNTER — Telehealth (INDEPENDENT_AMBULATORY_CARE_PROVIDER_SITE_OTHER): Payer: 59 | Admitting: Internal Medicine

## 2021-07-03 DIAGNOSIS — J320 Chronic maxillary sinusitis: Secondary | ICD-10-CM | POA: Diagnosis not present

## 2021-07-03 MED ORDER — AZITHROMYCIN 500 MG PO TABS: 500.0000 mg | ORAL_TABLET | Freq: Every day | ORAL | 0 refills | Status: DC

## 2021-07-03 NOTE — Progress Notes (Signed)
Virtual Visit via Caregility Note  This visit type was conducted due to national recommendations for restrictions regarding the COVID-19 pandemic (e.g. social distancing).  This format is felt to be most appropriate for this patient at this time.  All issues noted in this document were discussed and addressed.  No physical exam was performed (except for noted visual exam findings with Video Visits).   I connected withNAME@ on 07/03/21 at  4:45 PM EDT by a video enabled telemedicine application  and verified that I am speaking with the correct person using two identifiers. Location patient: home Location provider: work or home office Persons participating in the virtual visit: patient, provider  I discussed the limitations, risks, security and privacy concerns of performing an evaluation and management service by telephone and the availability of in person appointments. I also discussed with the patient that there may be a patient responsible charge related to this service. The patient expressed understanding and agreed to proceed.  Reason for visit: sinusitis  HPI:   Treated pm June 8 for allergic rhinitis /viral URI with flonase and decongestant.  Over the past 4 days has developed purulent nasal drainage,  persistent congestion,  moderate to severe left maxillary sinus pain that involves his teeth and  TMJ,  and headache.     ROS: See pertinent positives and negatives per HPI.  No past medical history on file.  Past Surgical History:  Procedure Laterality Date   TOOTH EXTRACTION  2008    Family History  Problem Relation Age of Onset   Cancer Mother 36       breast ca   Hypertension Mother    Hypertension Father     SOCIAL HX:  reports that he has never smoked. He has never used smokeless tobacco. He reports current alcohol use of about 3.0 standard drinks of alcohol per week. He reports that he does not use drugs.    Current Outpatient Medications:    azithromycin  (ZITHROMAX) 500 MG tablet, Take 1 tablet (500 mg total) by mouth daily., Disp: 7 tablet, Rfl: 0   finasteride (PROSCAR) 5 MG tablet, Take 5 mg by mouth daily., Disp: , Rfl:    fluticasone (FLONASE) 50 MCG/ACT nasal spray, Place 2 sprays into both nostrils daily., Disp: 16 g, Rfl: 0   Multiple Vitamin (MULTIVITAMIN) tablet, Take 1 tablet by mouth daily., Disp: , Rfl:    sertraline (ZOLOFT) 50 MG tablet, Take 1 tablet (50 mg total) by mouth daily., Disp: 30 tablet, Rfl: 3   UNABLE TO FIND, Med Name: Hims 31mL AntiAge Med (0.022%), Disp: , Rfl:    cetirizine (ZYRTEC) 10 MG tablet, Take 1 tablet (10 mg total) by mouth daily. (Patient not taking: Reported on 07/03/2021), Disp: 30 tablet, Rfl: 0  EXAM:  VITALS per patient if applicable:  GENERAL: alert, oriented, appears well and in no acute distress  HEENT: atraumatic, conjunttiva clear, no obvious abnormalities on inspection of external nose and ears  NECK: normal movements of the head and neck  LUNGS: on inspection no signs of respiratory distress, breathing rate appears normal, no obvious gross SOB, gasping or wheezing  CV: no obvious cyanosis  MS: moves all visible extremities without noticeable abnormality  PSYCH/NEURO: pleasant and cooperative, no obvious depression or anxiety, speech and thought processing grossly intact  ASSESSMENT AND PLAN:  Discussed the following assessment and plan:  Left maxillary sinusitis  Left maxillary sinusitis Symptoms progressed after using flonase,  Antihistamine and oral decongestant. And he reports heaache  left maxillary sinus pain and pain in upper teeth.  Given his allergies to PCN and cipro,  Will use azithromcyin 500 mg daily x 7 days . Marland KitchenDaily use of a probiotic advised for 3 weeks.     I discussed the assessment and treatment plan with the patient. The patient was provided an opportunity to ask questions and all were answered. The patient agreed with the plan and demonstrated an  understanding of the instructions.   The patient was advised to call back or seek an in-person evaluation if the symptoms worsen or if the condition fails to improve as anticipated.   I spent 20 minutes dedicated to the care of this patient on the date of this encounter to include pre-visit review of his medical history,  Face-to-face time with the patient , and post visit ordering of testing and therapeutics.    Sherlene Shams, MD

## 2021-07-03 NOTE — Assessment & Plan Note (Signed)
Symptoms progressed after using flonase,  Antihistamine and oral decongestant. And he reports heaache  left maxillary sinus pain and pain in upper teeth.  Given his allergies to PCN and cipro,  Will use azithromcyin 500 mg daily x 7 days . Marland KitchenDaily use of a probiotic advised for 3 weeks.

## 2021-07-12 ENCOUNTER — Other Ambulatory Visit: Payer: Self-pay | Admitting: Family Medicine

## 2021-07-12 DIAGNOSIS — J069 Acute upper respiratory infection, unspecified: Secondary | ICD-10-CM

## 2021-07-13 ENCOUNTER — Telehealth: Payer: Self-pay

## 2021-07-13 NOTE — Telephone Encounter (Signed)
06/29/21 - lvm asking patient to pls call us back to schedule his follow-up appointments from his MyChart visit with Dr. Burnis Medin

## 2021-07-21 ENCOUNTER — Other Ambulatory Visit: Payer: Self-pay | Admitting: Family Medicine

## 2021-07-21 DIAGNOSIS — F419 Anxiety disorder, unspecified: Secondary | ICD-10-CM

## 2021-08-07 ENCOUNTER — Ambulatory Visit (HOSPITAL_COMMUNITY)
Admission: RE | Admit: 2021-08-07 | Discharge: 2021-08-07 | Disposition: A | Discharge status: Home / Self Care | Payer: 59 | Source: Ambulatory Visit | Attending: Family Medicine | Admitting: Family Medicine

## 2021-08-07 DIAGNOSIS — R200 Anesthesia of skin: Secondary | ICD-10-CM | POA: Insufficient documentation

## 2021-08-11 ENCOUNTER — Other Ambulatory Visit: Payer: Self-pay | Admitting: Family

## 2021-08-11 ENCOUNTER — Encounter: Payer: Self-pay | Admitting: *Deleted

## 2021-08-11 DIAGNOSIS — R200 Anesthesia of skin: Secondary | ICD-10-CM

## 2021-08-11 DIAGNOSIS — M5030 Other cervical disc degeneration, unspecified cervical region: Secondary | ICD-10-CM

## 2021-09-15 ENCOUNTER — Encounter: Payer: Managed Care, Other (non HMO) | Admitting: Family Medicine

## 2022-01-03 ENCOUNTER — Ambulatory Visit: Payer: 59 | Admitting: Family Medicine

## 2022-01-16 ENCOUNTER — Ambulatory Visit: Payer: 59 | Admitting: Family Medicine

## 2022-02-12 ENCOUNTER — Telehealth: Payer: Self-pay | Admitting: Family Medicine

## 2022-02-12 NOTE — Telephone Encounter (Signed)
Patient's wife was wondering if patient needed a ultra sound before seeing his provider. She states that [facility] recommended seeing his provider and getting a ultra sound.

## 2022-02-13 NOTE — Telephone Encounter (Signed)
I called the patient and he requested a sooner appointment and he was scheduled and his other appointment was cancelled.  Cataleya Cristina,cma

## 2022-02-20 ENCOUNTER — Encounter: Payer: Self-pay | Admitting: Family Medicine

## 2022-02-20 ENCOUNTER — Ambulatory Visit (INDEPENDENT_AMBULATORY_CARE_PROVIDER_SITE_OTHER): Payer: 59 | Admitting: Family Medicine

## 2022-02-20 ENCOUNTER — Telehealth: Payer: Self-pay | Admitting: Family Medicine

## 2022-02-20 VITALS — BP 118/80 | HR 69 | Temp 98.5°F | Ht 73.0 in | Wt 201.8 lb

## 2022-02-20 DIAGNOSIS — M5412 Radiculopathy, cervical region: Secondary | ICD-10-CM | POA: Diagnosis not present

## 2022-02-20 DIAGNOSIS — E042 Nontoxic multinodular goiter: Secondary | ICD-10-CM

## 2022-02-20 DIAGNOSIS — Z23 Encounter for immunization: Secondary | ICD-10-CM

## 2022-02-20 DIAGNOSIS — F419 Anxiety disorder, unspecified: Secondary | ICD-10-CM | POA: Diagnosis not present

## 2022-02-20 LAB — TSH: TSH: 2.1 u[IU]/mL (ref 0.35–5.50)

## 2022-02-20 NOTE — Assessment & Plan Note (Signed)
Chronic issue.  Has improved some with the prednisone from orthopedics.  We will refer for physical therapy in [place].  If not resolving with that he will let us know.

## 2022-02-20 NOTE — Patient Instructions (Addendum)
Nice to see you. We will get lab work today and contact you with results. If you do not hear from somebody to schedule your ultrasound and physical therapy evaluation within the next week or so please let us know.

## 2022-02-20 NOTE — Assessment & Plan Note (Signed)
Generally well-controlled.  He will monitor for any worsening.

## 2022-02-20 NOTE — Telephone Encounter (Signed)
Lft pt vm to call ofc . thanks 

## 2022-02-20 NOTE — Telephone Encounter (Signed)
Pt called back returning Rasheedah call. Transferred.

## 2022-02-20 NOTE — Assessment & Plan Note (Signed)
Noted on recent MRI.  Will check a TSH.  We will have him complete an ultrasound of his thyroid.  Results will determine the neck step in management.

## 2022-02-20 NOTE — Progress Notes (Signed)
Tommi Rumps, MD Phone: [phone]  Leon Shaw is a 37 y.o. male who presents today for follow-up.  Neck pain: Patient notes that he ended up seeing orthopedics for this and they had an MRI that revealed a pinched nerve and other cervical degenerative disc issues.  He initially had some weakness in his left triceps though that has improved after taking prednisone.  He had some tingling in his thumb and index finger on the left side that persists.  He notes no numbness.  He notes the pain has improved with the prednisone.  He wonders about physical therapy.  Thyroid nodules: Noted on MRI for his neck.  Recommended to have further evaluation for this.  Anxiety: Patient notes this is generally okay overall although is a little heightened right now with the unknown of what the thyroid nodules will bring.  Social History   Tobacco Use  Smoking Status Never  Smokeless Tobacco Never    Current Outpatient Medications on File Prior to Visit  Medication Sig Dispense Refill   cetirizine (ZYRTEC) 10 MG tablet Take 1 tablet (10 mg total) by mouth daily. 30 tablet 0   finasteride (PROSCAR) 5 MG tablet Take 5 mg by mouth daily.     fluticasone (FLONASE) 50 MCG/ACT nasal spray SPRAY 2 SPRAYS INTO EACH NOSTRIL EVERY DAY 48 mL 1   Multiple Vitamin (MULTIVITAMIN) tablet Take 1 tablet by mouth daily.     UNABLE TO FIND Med Name: Hims 35mL AntiAge Med (0.022%)     No current facility-administered medications on file prior to visit.     ROS see history of present illness  Objective  Physical Exam Vitals:   02/20/22 0808  BP: 118/80  Pulse: 69  Temp: 98.5 F (36.9 C)  SpO2: 99%    BP Readings from Last 3 Encounters:  02/20/22 118/80  07/03/21 124/80  09/14/20 121/80   Wt Readings from Last 3 Encounters:  02/20/22 201 lb 12.8 oz (91.5 kg)  07/03/21 198 lb (89.8 kg)  06/28/21 198 lb (89.8 kg)    Physical Exam Constitutional:      General: He is not in acute distress.     Appearance: He is not diaphoretic.  Cardiovascular:     Rate and Rhythm: Normal rate and regular rhythm.     Heart sounds: Normal heart sounds.  Pulmonary:     Effort: Pulmonary effort is normal.     Breath sounds: Normal breath sounds.  Musculoskeletal:     Comments: No midline neck tenderness, no midline neck step-off, no muscular neck tenderness  Skin:    General: Skin is warm and dry.  Neurological:     Mental Status: He is alert.     Comments: 5/5 strength in bilateral biceps, triceps, grip, quads, hamstrings, plantar and dorsiflexion, sensation to light touch intact in bilateral UE and LE, normal gait      Assessment/Plan: Please see individual problem list.  Multiple thyroid nodules Assessment & Plan: Noted on recent MRI.  Will check a TSH.  We will have him complete an ultrasound of his thyroid.  Results will determine the neck step in management.  Orders: -     TSH -     US THYROID; Future  Anxiety Assessment & Plan: Generally well-controlled.  He will monitor for any worsening.   Cervical radiculopathy Assessment & Plan: Chronic issue.  Has improved some with the prednisone from orthopedics.  We will refer for physical therapy in [place].  If not resolving with that he  will let us know.  Orders: -     Ambulatory referral to Physical Therapy  Need for immunization against influenza -     Flu Vaccine QUAD 10mo+IM (Fluarix, Fluzone & Alfiuria Quad PF)     Return in about 3 months (around 05/22/2022) for physical.   Tommi Rumps, MD [place]

## 2022-02-26 NOTE — Therapy (Signed)
OUTPATIENT PHYSICAL THERAPY CERVICAL EVALUATION   Patient Name: Kass Herberger MRN: [id number] DOB:[birth year], 37 y.o., male Today's Date: 02/27/2022  END OF SESSION:  PT End of Session - 02/27/22 1612     Visit Number 1    Number of Visits 9    Date for PT Re-Evaluation 04/27/22    PT Start Time 1520    PT Stop Time 1609    PT Time Calculation (min) 49 min    Activity Tolerance Patient tolerated treatment well    Behavior During Therapy [facility] for tasks assessed/performed             History reviewed. No pertinent past medical history. Past Surgical History:  Procedure Laterality Date   TOOTH EXTRACTION  2008   Patient Active Problem List   Diagnosis Date Noted   Multiple thyroid nodules 02/20/2022   Left maxillary sinusitis 07/03/2021   Cervical radiculopathy 06/28/2021   COVID-19 12/12/2020   Low back strain 09/14/2020   Upper respiratory infection 02/03/2020   Anxiety 09/16/2017   Contact dermatitis 09/16/2017   Encounter for general adult medical examination with abnormal findings 12/27/2014    PCP: none provided  REFERRING PROVIDER: Leone Haven, MD  REFERRING DIAG: [phone] (ICD-10-CM) - Cervical radiculopathy  THERAPY DIAG:  Cervicalgia  Muscle weakness (generalized)  Pain in left upper arm  Rationale for Evaluation and Treatment: Rehabilitation  ONSET DATE:   SUBJECTIVE:                                                                                                                                                                                                         SUBJECTIVE STATEMENT: Pt arriving today reporting neck pain that began last June where he was noticing shoulder weakness when working out. Pt reporting numbness in thumb, index and middle finger on the left UE.   PERTINENT HISTORY:  unremarkable  PAIN:  NPRS scale: 4-5/10 at times Pain location: neck Pain description: dull, burning, achy Aggravating factors: sitting  prolonged, reaching downward Relieving factors: change positions  PRECAUTIONS: None  WEIGHT BEARING RESTRICTIONS: No  FALLS:  Has patient fallen in last 6 months? No  LIVING ENVIRONMENT: Lives with: lives with their family and lives with their spouse Lives in: House/apartment Stairs: Yes: Internal: 15 steps; on left going up Has following equipment at home: None  OCCUPATION: works for [employer] therapist, desk job  PLOF: Independent  PATIENT GOALS: Get back to gym work outs  Next MD visit:  OBJECTIVE:   DIAGNOSTIC FINDINGS:  FINDINGS: 08/07/21:  Normal alignment of the cervical vertebral  bodies. There are mild age advanced degenerative changes with degenerative disc disease and facet disease but no acute bony findings or abnormal prevertebral soft tissue swelling. The oblique films are limited due to under rotation but no obvious bony foraminal narrowing. The C1-2 articulations are maintained. Small cervical ribs are noted. The lung apices are clear.  PATIENT SURVEYS:  02/27/22: FOTO intake:   58%   COGNITION: Overall cognitive status: Within functional limits for tasks assessed  SENSATION: WFL  POSTURE: rounded shoulders and forward head  PALPATION: 02/27/22: left upper trap and levator tightness note   CERVICAL ROM:   ROM AROM (deg) 02/27/22  Flexion 40  Extension 35  Right lateral flexion 34  Left lateral flexion 26  Right rotation 60  Left rotation 72   (Blank rows = not tested)  UPPER EXTREMITY ROM:  Active ROM Right eval Left eval  Shoulder flexion [facility]  Shoulder extension [facility]  Shoulder abduction [facility]  Shoulder adduction    Shoulder extension    Shoulder internal rotation    Shoulder external rotation    Elbow flexion    Elbow extension    Wrist flexion    Wrist extension    Wrist ulnar deviation    Wrist radial deviation    Wrist pronation    Wrist supination     (Blank rows = not tested)  UPPER EXTREMITY MMT:  MMT  Right eval Left eval  Shoulder flexion 5/5 4/5  Shoulder extension 5/5 5/5  Shoulder abduction 5/5 5/5  Shoulder adduction    Shoulder extension    Shoulder internal rotation    Shoulder external rotation    Middle trapezius    Lower trapezius    Elbow flexion    Elbow extension    Wrist flexion    Wrist extension    Wrist ulnar deviation    Wrist radial deviation    Wrist pronation    Wrist supination    Grip strength 134.7 129.7   (Blank rows = not tested)   TODAY'S TREATMENT:                                                                                                                      DATE:  02/27/22:  Therex: HEP instruction/performance c cues for techniques, handout provided.  Trial set performed of each for comprehension and symptom assessment.  See below for exercise list  PATIENT EDUCATION:  Education details: HEP, POC Person educated: Patient Education method: Explanation, Demonstration, Verbal cues, and Handouts Education comprehension: verbalized understanding, returned demonstration, and verbal cues required  HOME EXERCISE PROGRAM: Access Code: [email or web address] URL: [email or web address] Date: 02/27/2022 Prepared by: Kearney Hard  Exercises - Seated Assisted Cervical Rotation with Towel  - 2-3 x daily - 7 x weekly - 3-5 reps - 10 seconds hold - Mid-Lower Cervical Extension SNAG with Strap  - 2-3 x daily - 7 x weekly - 5 reps - 10 seconds hold - Seated Upper Trap Stretch  - 2-3 x  daily - 7 x weekly - 5 reps - 10 seconds hold - Median Nerve Flossing - Tray  - 2-3 x daily - 7 x weekly - 10 reps - 2-3 seconds hold - Median Nerve Glide  - 2-3 x daily - 7 x weekly - 10 reps - 2-3 seconds hold - [place] Angels  - 2-3 x daily - 7 x weekly - 2 sets - 5 reps  ASSESSMENT:  CLINICAL IMPRESSION: Patient is a 37 y.o. male who comes to clinic with complaints of cervical pain with radiation down left UE into first 3 digits with median and radial nerve  radiculopathies. Pt presents with mobility, strength and movement coordination deficits that impair their ability to perform usual daily and recreational functional activities without increase difficulty/symptoms at this time.  Patient to benefit from skilled PT services to address impairments and limitations to improve to previous level of function without restriction secondary to condition.    OBJECTIVE IMPAIRMENTS: decreased ROM, decreased strength, impaired flexibility, and pain.   ACTIVITY LIMITATIONS: lifting, sleeping, and reach over head  PARTICIPATION LIMITATIONS: community activity, occupation, and yard work  PERSONAL FACTORS:  ongoing issue   are also affecting patient's functional outcome.   REHAB POTENTIAL: Good  CLINICAL DECISION MAKING: Stable/uncomplicated  EVALUATION COMPLEXITY: Low   GOALS: Goals reviewed with patient? Yes  SHORT TERM GOALS: (target date for Short term goals are 3 weeks 03/23/22)  1.Patient will demonstrate independent use of home exercise program to maintain progress from in clinic treatments. Goal status: New  LONG TERM GOALS: (target dates for all long term goals are weeks  04/27/22 )   1. Patient will demonstrate/report pain at worst less than or equal to 2/10 to facilitate minimal limitation in daily activity secondary to pain symptoms. Goal status: New   2. Patient will demonstrate independent use of home exercise program to facilitate ability to maintain/progress functional gains from skilled physical therapy services. Goal status: New   3. Patient will demonstrate FOTO outcome > or = 72 % to indicate reduced disability due to condition. Goal status: New   4.  Patient will demonstrate cervical AROM WFL s symptoms to facilitate usual head movements for daily activity including driving, self care.   Goal status: New   5.  Pt will be able to perform 25 push ups with no pain in her left arm or cervical spine and no deficits noted for return  to gym workouts.  Goal status: New   6.  Pt will be lift 15 pounds from floor to over head using his left UE using correct body mechanics.  Goal status: New      PLAN:  PT FREQUENCY: 1-2x/week  PT DURATION: 10 weeks  PLANNED INTERVENTIONS: Therapeutic exercises, Therapeutic activity, Neuro Muscular re-education, Balance training, Gait training, Patient/Family education, Joint mobilization, Stair training, DME instructions, Dry Needling, Electrical stimulation, Cryotherapy, vasopneumatic device,Traction, Moist heat, Taping, Ultrasound, Ionotophoresis 4mg /ml Dexamethasone, and Manual therapy.  All included unless contraindicated  PLAN FOR NEXT SESSION: Check HEP use/response, consider DN, cervical ROM, strengthening     Oretha Caprice, PT, MPT 02/27/2022, 4:13 PM

## 2022-02-27 ENCOUNTER — Other Ambulatory Visit: Payer: Self-pay

## 2022-02-27 ENCOUNTER — Encounter: Payer: Self-pay | Admitting: Physical Therapy

## 2022-02-27 ENCOUNTER — Ambulatory Visit (INDEPENDENT_AMBULATORY_CARE_PROVIDER_SITE_OTHER): Payer: 59 | Admitting: Physical Therapy

## 2022-02-27 DIAGNOSIS — M6281 Muscle weakness (generalized): Secondary | ICD-10-CM

## 2022-02-27 DIAGNOSIS — M79622 Pain in left upper arm: Secondary | ICD-10-CM | POA: Diagnosis not present

## 2022-02-27 DIAGNOSIS — M542 Cervicalgia: Secondary | ICD-10-CM | POA: Diagnosis not present

## 2022-02-28 ENCOUNTER — Ambulatory Visit
Admission: RE | Admit: 2022-02-28 | Discharge: 2022-02-28 | Disposition: A | Discharge status: Home / Self Care | Payer: 59 | Source: Ambulatory Visit | Attending: Family Medicine | Admitting: Family Medicine

## 2022-02-28 ENCOUNTER — Ambulatory Visit: Payer: 59 | Admitting: Family Medicine

## 2022-02-28 DIAGNOSIS — E042 Nontoxic multinodular goiter: Secondary | ICD-10-CM | POA: Insufficient documentation

## 2022-03-15 ENCOUNTER — Encounter: Payer: Self-pay | Admitting: Physical Therapy

## 2022-03-15 ENCOUNTER — Ambulatory Visit (INDEPENDENT_AMBULATORY_CARE_PROVIDER_SITE_OTHER): Payer: 59 | Admitting: Physical Therapy

## 2022-03-15 DIAGNOSIS — M6281 Muscle weakness (generalized): Secondary | ICD-10-CM | POA: Diagnosis not present

## 2022-03-15 DIAGNOSIS — M79622 Pain in left upper arm: Secondary | ICD-10-CM

## 2022-03-15 DIAGNOSIS — M542 Cervicalgia: Secondary | ICD-10-CM | POA: Diagnosis not present

## 2022-03-15 NOTE — Therapy (Signed)
OUTPATIENT PHYSICAL THERAPY Treatment   Patient Name: Leon Shaw MRN: [id number] DOB:May 15, 1985, 37 y.o., male Today's Date: 03/15/2022  END OF SESSION:  PT End of Session - 03/15/22 1624     Visit Number 2    Number of Visits 9    Date for PT Re-Evaluation 04/27/22    PT Start Time [id number]    PT Stop Time 1645    PT Time Calculation (min) 41 min    Activity Tolerance Patient tolerated treatment well    Behavior During Therapy [facility] for tasks assessed/performed             History reviewed. No pertinent past medical history. Past Surgical History:  Procedure Laterality Date   TOOTH EXTRACTION  2008   Patient Active Problem List   Diagnosis Date Noted   Multiple thyroid nodules 02/20/2022   Left maxillary sinusitis 07/03/2021   Cervical radiculopathy 06/28/2021   COVID-19 12/12/2020   Low back strain 09/14/2020   Upper respiratory infection 02/03/2020   Anxiety 09/16/2017   Contact dermatitis 09/16/2017   Encounter for general adult medical examination with abnormal findings 12/27/2014    PCP: none provided  REFERRING PROVIDER: Leone Haven, MD  REFERRING DIAG: [phone] (ICD-10-CM) - Cervical radiculopathy  THERAPY DIAG:  Cervicalgia  Muscle weakness (generalized)  Pain in left upper arm  Rationale for Evaluation and Treatment: Rehabilitation  ONSET DATE:   SUBJECTIVE:                                                                                                                                                                                                         SUBJECTIVE STATEMENT: Pt is having N/T in first 3 fingers. He will have upcoming NCV test  PERTINENT HISTORY:  unremarkable  PAIN:  NPRS scale: 4-5/10 at times Pain location: neck Pain description: dull, burning, achy Aggravating factors: sitting prolonged, reaching downward Relieving factors: change positions  PRECAUTIONS: None  WEIGHT BEARING RESTRICTIONS: No  FALLS:   Has patient fallen in last 6 months? No  LIVING ENVIRONMENT: Lives with: lives with their family and lives with their spouse Lives in: House/apartment Stairs: Yes: Internal: 15 steps; on left going up Has following equipment at home: None  OCCUPATION: works for Art therapist, desk job  PLOF: Independent  PATIENT GOALS: Get back to gym work outs  Next MD visit:  OBJECTIVE:   DIAGNOSTIC FINDINGS:  Based on MRI impression from Dr. Valinda Hoar note in care everywhere of the cervical spine demonstrates incidental thyroid nodules they recommended considering further evaluation by ultrasound. 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 Moderate degeneration and narrowing on the right at C5-6. Moderate right neuroforaminal narrowing at C3-4. A mild scoliosis.    MRI left shoulder demonstrates no rotator cuff tear. Mild AC arthrosis.   PATIENT SURVEYS:  02/27/22: FOTO intake:   58%   COGNITION: Overall cognitive status: Within functional limits for tasks assessed  SENSATION: WFL  POSTURE: rounded shoulders and forward head  PALPATION: 02/27/22: left upper trap and levator tightness note   CERVICAL ROM:   ROM AROM (deg) 02/27/22  Flexion 40  Extension 35  Right lateral flexion 34  Left lateral flexion 26  Right rotation 60  Left rotation 72   (Blank rows = not tested)  UPPER EXTREMITY ROM:  Active ROM Right eval Left eval  Shoulder flexion [facility] WFL  Shoulder extension [facility]  Shoulder abduction [facility]  Shoulder adduction    Shoulder extension    Shoulder internal rotation    Shoulder external rotation    Elbow flexion    Elbow extension    Wrist flexion    Wrist extension    Wrist ulnar deviation    Wrist radial deviation    Wrist pronation    Wrist supination     (Blank rows = not tested)  UPPER EXTREMITY MMT:  MMT Right eval Left eval  Shoulder flexion 5/5 4/5  Shoulder extension 5/5 5/5  Shoulder abduction 5/5 5/5  Shoulder adduction     Shoulder extension    Shoulder internal rotation    Shoulder external rotation    Middle trapezius    Lower trapezius    Elbow flexion    Elbow extension    Wrist flexion    Wrist extension    Wrist ulnar deviation    Wrist radial deviation    Wrist pronation    Wrist supination    Grip strength 134.7 129.7   (Blank rows = not tested)   TODAY'S TREATMENT:                                                                                                                      DATE:  03/15/22 -Mechanical cervical traction 20-15# intermittent X 15 min -Manual therapy for skilled palpation and Trigger Point Dry-Needling  Treatment instructions: Expect mild to moderate muscle soreness. Patient Consent Given: Yes Education handout provided: verbally provided Muscles treated: Lt Cervical paraspinals and multifidi, Lt upper traps, Treatment response/outcome: good overall tolerance,twitch response noted  Self care for home cervical traction unit  02/27/22:  Therex: HEP instruction/performance c cues for techniques, handout provided.  Trial set performed of each for comprehension and symptom assessment.  See below for exercise list  PATIENT EDUCATION:  Education details: HEP, POC Person educated: Patient Education method: Explanation, Demonstration, Verbal cues, and Handouts Education comprehension: verbalized understanding, returned demonstration, and verbal cues required  HOME EXERCISE PROGRAM: Access Code: [email or web address] URL: [email or web address] Date: 02/27/2022 Prepared by: Kearney Hard  Exercises - Seated Assisted Cervical Rotation with Towel  - 2-3  x daily - 7 x weekly - 3-5 reps - 10 seconds hold - Mid-Lower Cervical Extension SNAG with Strap  - 2-3 x daily - 7 x weekly - 5 reps - 10 seconds hold - Seated Upper Trap Stretch  - 2-3 x daily - 7 x weekly - 5 reps - 10 seconds hold - Median Nerve Flossing - Tray  - 2-3 x daily - 7 x weekly - 10 reps - 2-3 seconds  hold - Median Nerve Glide  - 2-3 x daily - 7 x weekly - 10 reps - 2-3 seconds hold - Wall Angels  - 2-3 x daily - 7 x weekly - 2 sets - 5 reps  ASSESSMENT:  CLINICAL IMPRESSION: He was having N/T in first 3 fingers upon arrival so we tried mechanical cervical traction. This helped resolve this when he was on it but then it came back. We also tried DN today to see if this helps reduce his pain and or radiculopathy. I did show him some home cervical traction units he could look at getting on home in he feels he needs to.    OBJECTIVE IMPAIRMENTS: decreased ROM, decreased strength, impaired flexibility, and pain.   ACTIVITY LIMITATIONS: lifting, sleeping, and reach over head  PARTICIPATION LIMITATIONS: community activity, occupation, and yard work  PERSONAL FACTORS:  ongoing issue   are also affecting patient's functional outcome.   REHAB POTENTIAL: Good  CLINICAL DECISION MAKING: Stable/uncomplicated  EVALUATION COMPLEXITY: Low   GOALS: Goals reviewed with patient? Yes  SHORT TERM GOALS: (target date for Short term goals are 3 weeks 03/23/22)  1.Patient will demonstrate independent use of home exercise program to maintain progress from in clinic treatments. Goal status: New  LONG TERM GOALS: (target dates for all long term goals are weeks  04/27/22 )   1. Patient will demonstrate/report pain at worst less than or equal to 2/10 to facilitate minimal limitation in daily activity secondary to pain symptoms. Goal status: New   2. Patient will demonstrate independent use of home exercise program to facilitate ability to maintain/progress functional gains from skilled physical therapy services. Goal status: New   3. Patient will demonstrate FOTO outcome > or = 72 % to indicate reduced disability due to condition. Goal status: New   4.  Patient will demonstrate cervical AROM WFL s symptoms to facilitate usual head movements for daily activity including driving, self care.   Goal  status: New   5.  Pt will be able to perform 25 push ups with no pain in her left arm or cervical spine and no deficits noted for return to gym workouts.  Goal status: New   6.  Pt will be lift 15 pounds from floor to over head using his left UE using correct body mechanics.  Goal status: New      PLAN:  PT FREQUENCY: 1-2x/week  PT DURATION: 10 weeks  PLANNED INTERVENTIONS: Therapeutic exercises, Therapeutic activity, Neuro Muscular re-education, Balance training, Gait training, Patient/Family education, Joint mobilization, Stair training, DME instructions, Dry Needling, Electrical stimulation, Cryotherapy, vasopneumatic device,Traction, Moist heat, Taping, Ultrasound, Ionotophoresis 49m/ml Dexamethasone, and Manual therapy.  All included unless contraindicated  PLAN FOR NEXT SESSION: how was DN and traction from last time and repeat if desired.   BDebbe Odea PT, DPT 03/15/2022, 4:54 PM

## 2022-03-22 ENCOUNTER — Ambulatory Visit (INDEPENDENT_AMBULATORY_CARE_PROVIDER_SITE_OTHER): Payer: 59 | Admitting: Physical Therapy

## 2022-03-22 ENCOUNTER — Encounter: Payer: Self-pay | Admitting: Physical Therapy

## 2022-03-22 DIAGNOSIS — M542 Cervicalgia: Secondary | ICD-10-CM

## 2022-03-22 DIAGNOSIS — M6281 Muscle weakness (generalized): Secondary | ICD-10-CM

## 2022-03-22 DIAGNOSIS — M79622 Pain in left upper arm: Secondary | ICD-10-CM | POA: Diagnosis not present

## 2022-03-22 NOTE — Therapy (Signed)
OUTPATIENT PHYSICAL THERAPY Treatment   Patient Name: Leon Shaw MRN: [id number] DOB:Nov 29, 1985, 37 y.o., male Today's Date: 03/22/2022  END OF SESSION:  PT End of Session - 03/22/22 1630     Visit Number 3    Number of Visits 9    Date for PT Re-Evaluation 04/27/22    PT Start Time 1601    PT Stop Time 1649    PT Time Calculation (min) 48 min    Activity Tolerance Patient tolerated treatment well    Behavior During Therapy [facility] for tasks assessed/performed              History reviewed. No pertinent past medical history. Past Surgical History:  Procedure Laterality Date   TOOTH EXTRACTION  2008   Patient Active Problem List   Diagnosis Date Noted   Multiple thyroid nodules 02/20/2022   Left maxillary sinusitis 07/03/2021   Cervical radiculopathy 06/28/2021   COVID-19 12/12/2020   Low back strain 09/14/2020   Upper respiratory infection 02/03/2020   Anxiety 09/16/2017   Contact dermatitis 09/16/2017   Encounter for general adult medical examination with abnormal findings 12/27/2014    PCP: none provided  REFERRING PROVIDER: Leone Haven, MD  REFERRING DIAG: [phone] (ICD-10-CM) - Cervical radiculopathy  THERAPY DIAG:  Cervicalgia  Muscle weakness (generalized)  Pain in left upper arm  Rationale for Evaluation and Treatment: Rehabilitation  ONSET DATE:   SUBJECTIVE:                                                                                                                                                                                                         SUBJECTIVE STATEMENT: Relays he is about the same. Still with N/T in first 3 fingers and weakness in tricep.   PERTINENT HISTORY:  unremarkable  PAIN:  NPRS scale: 4-5/10 at times Pain location: neck Pain description: dull, burning, achy Aggravating factors: sitting prolonged, reaching downward Relieving factors: change positions  PRECAUTIONS: None  WEIGHT BEARING  RESTRICTIONS: No  FALLS:  Has patient fallen in last 6 months? No  LIVING ENVIRONMENT: Lives with: lives with their family and lives with their spouse Lives in: House/apartment Stairs: Yes: Internal: 15 steps; on left going up Has following equipment at home: None  OCCUPATION: works for Art therapist, desk job  PLOF: Independent  PATIENT GOALS: Get back to gym work outs  Next MD visit:  OBJECTIVE:   DIAGNOSTIC FINDINGS:  Based on MRI impression from Dr. Valinda Hoar note in care everywhere of the cervical spine demonstrates incidental thyroid nodules they recommended considering further evaluation  by ultrasound. 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 Moderate degeneration and narrowing on the right at C5-6. Moderate right neuroforaminal narrowing at C3-4. A mild scoliosis.    MRI left shoulder demonstrates no rotator cuff tear. Mild AC arthrosis.   EMG/NCS data for electrodiagnostic impression:  Clinical Impression:  Abnormal study.  Evidence of an acute cervical radiculopathy affecting the left C7 nerve root. 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  No evidence of an ulnar motor entrapment neuropathy at the left wrist or left elbow.  Prolonged left ulnar sensory peak latency is likely due to cool temperature. No suspicion for peripheral neuropathy.  No evidence of a myopathy in the left upper limb.   PATIENT SURVEYS:  02/27/22: FOTO intake:   58%   COGNITION: Overall cognitive status: Within functional limits for tasks assessed  SENSATION: WFL  POSTURE: rounded shoulders and forward head  PALPATION: 02/27/22: left upper trap and levator tightness note   CERVICAL ROM:   ROM AROM (deg) 02/27/22  Flexion 40  Extension 35  Right lateral flexion 34  Left lateral flexion 26  Right rotation 60  Left rotation 72   (Blank  rows = not tested)  UPPER EXTREMITY ROM:  Active ROM Right eval Left eval  Shoulder flexion [facility]  Shoulder extension [facility]  Shoulder abduction [facility]  Shoulder adduction    Shoulder extension    Shoulder internal rotation    Shoulder external rotation    Elbow flexion    Elbow extension    Wrist flexion    Wrist extension    Wrist ulnar deviation    Wrist radial deviation    Wrist pronation    Wrist supination     (Blank rows = not tested)  UPPER EXTREMITY MMT:  MMT Right eval Left eval  Shoulder flexion 5/5 4/5  Shoulder extension 5/5 5/5  Shoulder abduction 5/5 5/5  Shoulder adduction    Shoulder extension    Shoulder internal rotation    Shoulder external rotation    Middle trapezius    Lower trapezius    Elbow flexion    Elbow extension    Wrist flexion    Wrist extension    Wrist ulnar deviation    Wrist radial deviation    Wrist pronation    Wrist supination    Grip strength 134.7 129.7   (Blank rows = not tested)   TODAY'S TREATMENT:                                                                                                                      DATE:  03/22/22 -Mechanical cervical traction 25-20# intermittent X 20 min -Seated cervical extension SNAG 2X10 -Seated cervical retraction SNAG 2X10 -Seated thoracic extension 2X10 -Bent over tricep kickbacks 5# 2X10 each grip X3 -Triceps rope pulldown 45# 2X10 -Single arm chest  press machine 15# 2X10  03/15/22 -Mechanical cervical traction 20-15# intermittent X 15 min -Manual therapy for skilled palpation and Trigger Point Dry-Needling  Treatment instructions: Expect mild to moderate muscle soreness. Patient Consent Given: Yes Education handout provided: verbally provided Muscles treated: Lt Cervical paraspinals and multifidi, Lt upper traps, Treatment response/outcome: good overall tolerance,twitch response noted  Self care for home cervical traction unit  02/27/22:  Therex: HEP  instruction/performance c cues for techniques, handout provided.  Trial set performed of each for comprehension and symptom assessment.  See below for exercise list  PATIENT EDUCATION:  Education details: HEP, POC Person educated: Patient Education method: Explanation, Demonstration, Verbal cues, and Handouts Education comprehension: verbalized understanding, returned demonstration, and verbal cues required  HOME EXERCISE PROGRAM: Access Code: [email or web address] URL: [email or web address] Date: 02/27/2022 Prepared by: Kearney Hard  Exercises - Seated Assisted Cervical Rotation with Towel  - 2-3 x daily - 7 x weekly - 3-5 reps - 10 seconds hold - Mid-Lower Cervical Extension SNAG with Strap  - 2-3 x daily - 7 x weekly - 5 reps - 10 seconds hold - Seated Upper Trap Stretch  - 2-3 x daily - 7 x weekly - 5 reps - 10 seconds hold - Median Nerve Flossing - Tray  - 2-3 x daily - 7 x weekly - 10 reps - 2-3 seconds hold - Median Nerve Glide  - 2-3 x daily - 7 x weekly - 10 reps - 2-3 seconds hold - Wall Angels  - 2-3 x daily - 7 x weekly - 2 sets - 5 reps  ASSESSMENT:  CLINICAL IMPRESSION: I used more force and time on mechanical cervical traction and seemed to help more today. I also added in radial nerve glide, more cervical mckenzie exercises, and tricep strengthening based on his NCV results, see above for details.    OBJECTIVE IMPAIRMENTS: decreased ROM, decreased strength, impaired flexibility, and pain.   ACTIVITY LIMITATIONS: lifting, sleeping, and reach over head  PARTICIPATION LIMITATIONS: community activity, occupation, and yard work  PERSONAL FACTORS:  ongoing issue   are also affecting patient's functional outcome.   REHAB POTENTIAL: Good  CLINICAL DECISION MAKING: Stable/uncomplicated  EVALUATION COMPLEXITY: Low   GOALS: Goals reviewed with patient? Yes  SHORT TERM GOALS: (target date for Short term goals are 3 weeks 03/23/22)  1.Patient will demonstrate  independent use of home exercise program to maintain progress from in clinic treatments. Goal status: New  LONG TERM GOALS: (target dates for all long term goals are weeks  04/27/22 )   1. Patient will demonstrate/report pain at worst less than or equal to 2/10 to facilitate minimal limitation in daily activity secondary to pain symptoms. Goal status: New   2. Patient will demonstrate independent use of home exercise program to facilitate ability to maintain/progress functional gains from skilled physical therapy services. Goal status: New   3. Patient will demonstrate FOTO outcome > or = 72 % to indicate reduced disability due to condition. Goal status: New   4.  Patient will demonstrate cervical AROM WFL s symptoms to facilitate usual head movements for daily activity including driving, self care.   Goal status: New   5.  Pt will be able to perform 25 push ups with no pain in her left arm or cervical spine and no deficits noted for return to gym workouts.  Goal status: New   6.  Pt will be lift 15 pounds from floor to over head using his left UE using correct body mechanics.  Goal status: New      PLAN:  PT FREQUENCY: 1-2x/week  PT DURATION: 10 weeks  PLANNED INTERVENTIONS: Therapeutic exercises, Therapeutic activity, Neuro Muscular re-education, Balance training, Gait training, Patient/Family education, Joint mobilization, Stair training, DME instructions, Dry Needling, Electrical stimulation, Cryotherapy, vasopneumatic device,Traction, Moist heat, Taping, Ultrasound, Ionotophoresis '4mg'$ /ml Dexamethasone, and Manual therapy.  All included unless contraindicated  PLAN FOR NEXT SESSION: traction if desired, radial nerve glides, tricep strengthening, neck mckenzie program.    Debbe Odea, PT, DPT 03/22/2022, 5:02 PM

## 2022-03-28 ENCOUNTER — Other Ambulatory Visit: Payer: Self-pay | Admitting: Neurosurgery

## 2022-03-29 ENCOUNTER — Encounter: Payer: Self-pay | Admitting: Rehabilitative and Restorative Service Providers"

## 2022-03-29 ENCOUNTER — Ambulatory Visit (INDEPENDENT_AMBULATORY_CARE_PROVIDER_SITE_OTHER): Payer: 59 | Admitting: Rehabilitative and Restorative Service Providers"

## 2022-03-29 DIAGNOSIS — M6281 Muscle weakness (generalized): Secondary | ICD-10-CM | POA: Diagnosis not present

## 2022-03-29 DIAGNOSIS — M542 Cervicalgia: Secondary | ICD-10-CM | POA: Diagnosis not present

## 2022-03-29 DIAGNOSIS — M79622 Pain in left upper arm: Secondary | ICD-10-CM | POA: Diagnosis not present

## 2022-03-29 NOTE — Therapy (Signed)
OUTPATIENT PHYSICAL THERAPY Treatment [place]   Patient Name: Leon Shaw MRN: [id number] DOB:[birth year], 37 y.o., male Today's Date: 03/29/2022  PHYSICAL THERAPY DISCHARGE SUMMARY  Visits from Start of Care: 4  Current functional level related to goals / functional outcomes: See note   Remaining deficits: See note   Education / Equipment: HEP  Patient goals were not met. Patient is being discharged due to  to have surgery.    END OF SESSION:  PT End of Session - 03/29/22 1554     Visit Number 4    Number of Visits 9    Date for PT Re-Evaluation 04/27/22    PT Start Time [id number]    PT Stop Time 1620    PT Time Calculation (min) 26 min    Activity Tolerance Patient tolerated treatment well    Behavior During Therapy [facility] for tasks assessed/performed               History reviewed. No pertinent past medical history. Past Surgical History:  Procedure Laterality Date   TOOTH EXTRACTION  2008   Patient Active Problem List   Diagnosis Date Noted   Multiple thyroid nodules 02/20/2022   Left maxillary sinusitis 07/03/2021   Cervical radiculopathy 06/28/2021   COVID-19 12/12/2020   Low back strain 09/14/2020   Upper respiratory infection 02/03/2020   Anxiety 09/16/2017   Contact dermatitis 09/16/2017   Encounter for general adult medical examination with abnormal findings 12/27/2014    PCP: none provided  REFERRING PROVIDER: Leone Haven, MD  REFERRING DIAG: [phone] (ICD-10-CM) - Cervical radiculopathy  THERAPY DIAG:  Cervicalgia  Muscle weakness (generalized)  Pain in left upper arm  Rationale for Evaluation and Treatment: Rehabilitation  ONSET DATE:   SUBJECTIVE:                                                                                                                                                                                                         SUBJECTIVE STATEMENT: Pt indicated feeling about the same overall since  start.  He indicated having some short term relief at time but overall still having complaints.  Surgery  PERTINENT HISTORY:  unremarkable  PAIN:  NPRS scale: 6-7/10 Pain location: Lt trap and into Lt arm Pain description: dull, burning, achy Aggravating factors: sitting prolonged, reaching downward Relieving factors: change positions  PRECAUTIONS: None  WEIGHT BEARING RESTRICTIONS: No  FALLS:  Has patient fallen in last 6 months? No  LIVING ENVIRONMENT: Lives with: lives with their family and lives with their spouse Lives in: House/apartment Stairs: Yes: Internal:  15 steps; on left going up Has following equipment at home: None  OCCUPATION: works for [employer], desk job  PLOF: Independent  PATIENT GOALS: Get back to gym work outs  Next MD visit:  OBJECTIVE:   DIAGNOSTIC FINDINGS:  Based on MRI impression from Dr. Valinda Hoar note in care everywhere of the cervical spine demonstrates incidental thyroid nodules they recommended considering further evaluation by ultrasound. 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 Moderate degeneration and narrowing on the right at C5-6. Moderate right neuroforaminal narrowing at C3-4. A mild scoliosis.    MRI left shoulder demonstrates no rotator cuff tear. Mild AC arthrosis.   EMG/NCS data for electrodiagnostic impression:  Clinical Impression:  Abnormal study.  Evidence of an acute cervical radiculopathy affecting the left C7 nerve root. 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  No evidence of an ulnar motor entrapment neuropathy at the left wrist or left elbow.  Prolonged left ulnar sensory peak latency is likely due to cool temperature. No suspicion for peripheral neuropathy.  No evidence of a myopathy in the left upper limb.   PATIENT SURVEYS:  03/29/2022 FOTO update:  53  %  02/27/22: FOTO intake:   58%   COGNITION: Overall cognitive status: Within functional limits for tasks assessed  SENSATION: WFL  POSTURE: rounded shoulders and forward head  PALPATION: 02/27/22: left upper trap and levator tightness note   CERVICAL ROM:   ROM AROM (deg) 02/27/22 AROM 03/29/2022  Flexion 40 40  Extension 35 48  Right lateral flexion 34   Left lateral flexion 26   Right rotation 60 78  Left rotation 72 72   (Blank rows = not tested)  UPPER EXTREMITY ROM:  Active ROM Right eval Left eval  Shoulder flexion [facility] WFL  Shoulder extension [facility]  Shoulder abduction [facility]  Shoulder adduction    Shoulder extension    Shoulder internal rotation    Shoulder external rotation    Elbow flexion    Elbow extension    Wrist flexion    Wrist extension    Wrist ulnar deviation    Wrist radial deviation    Wrist pronation    Wrist supination     (Blank rows = not tested)  UPPER EXTREMITY MMT:  MMT Right eval Left eval  Shoulder flexion 5/5 4/5  Shoulder extension 5/5 5/5  Shoulder abduction 5/5 5/5  Shoulder adduction    Shoulder extension    Shoulder internal rotation    Shoulder external rotation    Middle trapezius    Lower trapezius    Elbow flexion    Elbow extension    Wrist flexion    Wrist extension    Wrist ulnar deviation    Wrist radial deviation    Wrist pronation    Wrist supination    Grip strength 134.7 129.7   (Blank rows = not tested)   TODAY'S TREATMENT:                                                                          DATE:03/29/2022 Traction: Cervical traction  supine in 20 deg flexion intermittent 60 sec on /20 sec off 18 lbs/13 lbs 15 mins per Pt request  Therex: Review of existing HEP c verbal cues for continued use as tolerated.  See HEP listed below for exercises reviewed.  Trial set of banded exercise for comprehension including rows, gh ext, bilateral ER c scapular retraction     TODAY'S TREATMENT:                                                                           DATE: 03/22/22 -Mechanical cervical traction 25-20# intermittent X 20 min -Seated cervical extension SNAG 2X10 -Seated cervical retraction SNAG 2X10 -Seated thoracic extension 2X10 -Bent over tricep kickbacks 5# 2X10 each grip X3 -Triceps rope pulldown 45# 2X10 -Single arm chest press machine 15# 2X10  TODAY'S TREATMENT:                                                                          DATE:03/15/22 -Mechanical cervical traction 20-15# intermittent X 15 min -Manual therapy for skilled palpation and Trigger Point Dry-Needling  Treatment instructions: Expect mild to moderate muscle soreness. Patient Consent Given: Yes Education handout provided: verbally provided Muscles treated: Lt Cervical paraspinals and multifidi, Lt upper traps, Treatment response/outcome: good overall tolerance,twitch response noted  Self care for home cervical traction unit  TODAY'S TREATMENT:                                                                          DATE:02/27/22:  Therex: HEP instruction/performance c cues for techniques, handout provided.  Trial set performed of each for comprehension and symptom assessment.  See below for exercise list  PATIENT EDUCATION:  03/29/2022 Education details: HEP update Person educated: Patient Education method: Explanation, Demonstration, Verbal cues, and Handouts Education comprehension: verbalized understanding, returned demonstration, and verbal cues required  HOME EXERCISE PROGRAM: Access Code: [email or web address] URL: [email or web address] Date: 03/29/2022 Prepared by: Scot Jun  Exercises - Seated Thoracic Lumbar Extension with Pectoralis Stretch  - 2 x daily - 6 x weekly - 1-2 sets - 10 reps - 5 sec hold - Standing Radial Nerve Glide  - 2 x daily - 6 x weekly - 1-2 sets - 10 reps - Seated Cervical Retraction  - 2 x daily - 6 x weekly - 1-2 sets - 10 reps - Mid-Lower Cervical  Extension SNAG with Strap  - 2-3 x daily - 7 x weekly - 5 reps - 10 seconds hold - Seated Upper Trap Stretch  - 2-3 x daily - 7 x weekly - 5 reps - 10 seconds hold - Median Nerve Flossing - Tray  - 2-3 x daily - 7 x weekly - 10  reps - 2-3 seconds hold - Standing Shoulder Row with Anchored Resistance  - 1-2 x daily - 7 x weekly - 1-2 sets - 10-15 reps - Shoulder Extension with Resistance  - 1-2 x daily - 7 x weekly - 1-2 sets - 10-15 reps - Shoulder External Rotation and Scapular Retraction with Resistance  - 1-2 x daily - 7 x weekly - 1-2 sets - 10-15 reps  ASSESSMENT:  CLINICAL IMPRESSION: Pt had attended 4 visits during course of treatment reporting +0 on global rating of change.  Due to presentation, Pt indicated plan to schedule surgery as recommended by neurosurgeon.  Reviewed HEP to emphasize continued use as tolerated.  Recommend D/C at this time due to surgery plans.    OBJECTIVE IMPAIRMENTS: decreased ROM, decreased strength, impaired flexibility, and pain.   ACTIVITY LIMITATIONS: lifting, sleeping, and reach over head  PARTICIPATION LIMITATIONS: community activity, occupation, and yard work  PERSONAL FACTORS:  ongoing issue   are also affecting patient's functional outcome.   REHAB POTENTIAL: Good  CLINICAL DECISION MAKING: Stable/uncomplicated  EVALUATION COMPLEXITY: Low   GOALS: Goals reviewed with patient? Yes  SHORT TERM GOALS: (target date for Short term goals are 3 weeks 03/23/22)  1.Patient will demonstrate independent use of home exercise program to maintain progress from in clinic treatments. Goal status: Met  LONG TERM GOALS: (target dates for all long term goals are weeks  04/27/22 )   1. Patient will demonstrate/report pain at worst less than or equal to 2/10 to facilitate minimal limitation in daily activity secondary to pain symptoms. Goal status: Not met   2. Patient will demonstrate independent use of home exercise program to facilitate ability to  maintain/progress functional gains from skilled physical therapy services. Goal status: Met   3. Patient will demonstrate FOTO outcome > or = 72 % to indicate reduced disability due to condition. Goal status: not met   4.  Patient will demonstrate cervical AROM WFL s symptoms to facilitate usual head movements for daily activity including driving, self care.   Goal status: partially met   5.  Pt will be able to perform 25 push ups with no pain in her left arm or cervical spine and no deficits noted for return to gym workouts.  Goal status: no met   6.  Pt will be lift 15 pounds from floor to over head using his left UE using correct body mechanics.  Goal status: not met      PLAN:  PT FREQUENCY:   PT DURATION:  PLANNED INTERVENTIONS: Therapeutic exercises, Therapeutic activity, Neuro Muscular re-education, Balance training, Gait training, Patient/Family education, Joint mobilization, Stair training, DME instructions, Dry Needling, Electrical stimulation, Cryotherapy, vasopneumatic device,Traction, Moist heat, Taping, Ultrasound, Ionotophoresis '4mg'$ /ml Dexamethasone, and Manual therapy.  All included unless contraindicated  PLAN FOR NEXT SESSION: Discharge due to surgery plans.   Scot Jun, PT, DPT, OCS, ATC 03/29/22  4:15 PM

## 2022-04-05 ENCOUNTER — Encounter: Payer: 59 | Admitting: Rehabilitative and Restorative Service Providers"

## 2022-04-09 ENCOUNTER — Other Ambulatory Visit (HOSPITAL_COMMUNITY): Payer: Self-pay

## 2022-04-09 ENCOUNTER — Ambulatory Visit: Admit: 2022-04-09 | Payer: 59 | Admitting: Neurosurgery

## 2022-04-09 SURGERY — ANTERIOR CERVICAL DECOMPRESSION/DISCECTOMY FUSION 2 LEVELS
Anesthesia: General

## 2022-04-09 MED ORDER — TIZANIDINE HCL 2 MG PO TABS
2.0000 mg | ORAL_TABLET | Freq: Four times a day (QID) | ORAL | 0 refills | Status: DC | PRN
  Filled 2022-04-09: qty 45, 12d supply, fill #0

## 2022-04-09 MED ORDER — HYDROCODONE-ACETAMINOPHEN 5-325 MG PO TABS
1.0000 | ORAL_TABLET | Freq: Four times a day (QID) | ORAL | 0 refills | Status: DC | PRN
  Filled 2022-04-09: qty 30, 8d supply, fill #0

## 2022-04-12 ENCOUNTER — Encounter: Payer: 59 | Admitting: Physical Therapy

## 2022-05-23 ENCOUNTER — Encounter: Payer: 59 | Admitting: Family Medicine

## 2022-11-28 ENCOUNTER — Other Ambulatory Visit (HOSPITAL_COMMUNITY): Payer: Self-pay

## 2022-11-28 MED ORDER — MELOXICAM 15 MG PO TABS
15.0000 mg | ORAL_TABLET | Freq: Every day | ORAL | 0 refills | Status: AC
  Filled 2022-11-28: qty 14, 14d supply, fill #0

## 2022-12-05 ENCOUNTER — Other Ambulatory Visit (HOSPITAL_COMMUNITY): Payer: Self-pay

## 2022-12-05 MED ORDER — D-5000 125 MCG (5000 UT) PO TABS
1.0000 | ORAL_TABLET | Freq: Every day | ORAL | 0 refills | Status: AC
  Filled 2023-01-04: qty 100, 100d supply, fill #0

## 2023-01-04 ENCOUNTER — Other Ambulatory Visit (HOSPITAL_COMMUNITY): Payer: Self-pay

## 2023-01-07 ENCOUNTER — Other Ambulatory Visit (HOSPITAL_COMMUNITY): Payer: Self-pay

## 2023-04-02 ENCOUNTER — Telehealth: Payer: Self-pay | Admitting: Family Medicine

## 2023-04-02 NOTE — Telephone Encounter (Signed)
 Called pt to call back and get appointment for a TOC slot for Dr. Charlann Lange due to provider Birdie Sons has left our office. Please advise.

## 2023-09-26 ENCOUNTER — Other Ambulatory Visit (HOSPITAL_COMMUNITY): Payer: Self-pay

## 2023-09-26 MED ORDER — BUPROPION HCL ER (XL) 150 MG PO TB24
150.0000 mg | ORAL_TABLET | Freq: Every morning | ORAL | 1 refills | Status: AC
  Filled 2023-09-26: qty 30, 30d supply, fill #0
